# Patient Record
Sex: Male | Born: 1960 | Race: White | Hispanic: Refuse to answer | Smoking: Never smoker
Health system: Southern US, Community
[De-identification: ages and names within clinical notes are randomized; demographics above are authoritative.]

## PROBLEM LIST (undated history)

## (undated) DIAGNOSIS — N183 Chronic kidney disease, stage 3 unspecified: Secondary | ICD-10-CM

## (undated) DIAGNOSIS — I77810 Thoracic aortic ectasia: Secondary | ICD-10-CM

## (undated) DIAGNOSIS — G4730 Sleep apnea, unspecified: Secondary | ICD-10-CM

## (undated) DIAGNOSIS — I10 Essential (primary) hypertension: Secondary | ICD-10-CM

## (undated) DIAGNOSIS — E7800 Pure hypercholesterolemia, unspecified: Secondary | ICD-10-CM

## (undated) DIAGNOSIS — I509 Heart failure, unspecified: Secondary | ICD-10-CM

## (undated) DIAGNOSIS — I428 Other cardiomyopathies: Secondary | ICD-10-CM

## (undated) DIAGNOSIS — I5189 Other ill-defined heart diseases: Secondary | ICD-10-CM

## (undated) DIAGNOSIS — N039 Chronic nephritic syndrome with unspecified morphologic changes: Secondary | ICD-10-CM

## (undated) DIAGNOSIS — I82409 Acute embolism and thrombosis of unspecified deep veins of unspecified lower extremity: Secondary | ICD-10-CM

## (undated) DIAGNOSIS — I130 Hypertensive heart and chronic kidney disease with heart failure and stage 1 through stage 4 chronic kidney disease, or unspecified chronic kidney disease: Secondary | ICD-10-CM

## (undated) DIAGNOSIS — I5042 Chronic combined systolic (congestive) and diastolic (congestive) heart failure: Secondary | ICD-10-CM

## (undated) HISTORY — DX: Other ill-defined heart diseases: I51.89

## (undated) HISTORY — DX: Thoracic aortic ectasia: I77.810

## (undated) HISTORY — DX: Hypertensive heart and chronic kidney disease with heart failure and stage 1 through stage 4 chronic kidney disease, or unspecified chronic kidney disease: I13.0

## (undated) HISTORY — DX: Chronic kidney disease, stage 3 (moderate): N18.3

## (undated) HISTORY — DX: Other cardiomyopathies: I42.8

## (undated) HISTORY — PX: OTHER SURGICAL HISTORY: SHX169

## (undated) HISTORY — DX: Chronic nephritic syndrome with unspecified morphologic changes: I50.9

## (undated) HISTORY — DX: Acute embolism and thrombosis of unspecified deep veins of unspecified lower extremity: I82.409

## (undated) HISTORY — DX: Chronic combined systolic (congestive) and diastolic (congestive) heart failure: I50.42

## (undated) HISTORY — DX: Chronic kidney disease, stage 3 unspecified: N18.30

## (undated) HISTORY — DX: Essential (primary) hypertension: I10

## (undated) HISTORY — DX: Sleep apnea, unspecified: G47.30

## (undated) HISTORY — DX: Chronic nephritic syndrome with unspecified morphologic changes: N03.9

## (undated) HISTORY — DX: Pure hypercholesterolemia, unspecified: E78.00

---

## 2008-09-12 ENCOUNTER — Inpatient Hospital Stay: Payer: Self-pay | Admitting: Internal Medicine

## 2011-09-05 DIAGNOSIS — I82409 Acute embolism and thrombosis of unspecified deep veins of unspecified lower extremity: Secondary | ICD-10-CM

## 2011-09-05 HISTORY — DX: Acute embolism and thrombosis of unspecified deep veins of unspecified lower extremity: I82.409

## 2013-08-16 ENCOUNTER — Encounter: Payer: Self-pay | Admitting: *Deleted

## 2013-08-16 ENCOUNTER — Encounter: Payer: Self-pay | Admitting: Cardiology

## 2013-08-16 DIAGNOSIS — I5042 Chronic combined systolic (congestive) and diastolic (congestive) heart failure: Secondary | ICD-10-CM | POA: Insufficient documentation

## 2013-08-16 DIAGNOSIS — I509 Heart failure, unspecified: Secondary | ICD-10-CM

## 2013-08-16 DIAGNOSIS — N039 Chronic nephritic syndrome with unspecified morphologic changes: Secondary | ICD-10-CM

## 2013-08-16 DIAGNOSIS — G4733 Obstructive sleep apnea (adult) (pediatric): Secondary | ICD-10-CM | POA: Insufficient documentation

## 2013-08-16 DIAGNOSIS — E7800 Pure hypercholesterolemia, unspecified: Secondary | ICD-10-CM | POA: Insufficient documentation

## 2013-08-16 DIAGNOSIS — I82409 Acute embolism and thrombosis of unspecified deep veins of unspecified lower extremity: Secondary | ICD-10-CM | POA: Insufficient documentation

## 2013-08-16 DIAGNOSIS — I130 Hypertensive heart and chronic kidney disease with heart failure and stage 1 through stage 4 chronic kidney disease, or unspecified chronic kidney disease: Secondary | ICD-10-CM | POA: Insufficient documentation

## 2013-08-16 DIAGNOSIS — I429 Cardiomyopathy, unspecified: Secondary | ICD-10-CM | POA: Insufficient documentation

## 2013-08-18 ENCOUNTER — Encounter: Payer: Self-pay | Admitting: General Surgery

## 2013-08-18 ENCOUNTER — Encounter: Payer: Self-pay | Admitting: Cardiology

## 2013-08-18 ENCOUNTER — Ambulatory Visit (INDEPENDENT_AMBULATORY_CARE_PROVIDER_SITE_OTHER): Payer: BC Managed Care – PPO | Admitting: Cardiology

## 2013-08-18 VITALS — BP 145/72 | HR 89 | Ht 66.5 in | Wt 297.0 lb

## 2013-08-18 DIAGNOSIS — I77810 Thoracic aortic ectasia: Secondary | ICD-10-CM | POA: Insufficient documentation

## 2013-08-18 DIAGNOSIS — I509 Heart failure, unspecified: Secondary | ICD-10-CM

## 2013-08-18 DIAGNOSIS — I428 Other cardiomyopathies: Secondary | ICD-10-CM

## 2013-08-18 DIAGNOSIS — G4733 Obstructive sleep apnea (adult) (pediatric): Secondary | ICD-10-CM

## 2013-08-18 DIAGNOSIS — I10 Essential (primary) hypertension: Secondary | ICD-10-CM | POA: Insufficient documentation

## 2013-08-18 DIAGNOSIS — I429 Cardiomyopathy, unspecified: Secondary | ICD-10-CM

## 2013-08-18 DIAGNOSIS — I5042 Chronic combined systolic (congestive) and diastolic (congestive) heart failure: Secondary | ICD-10-CM

## 2013-08-18 LAB — BASIC METABOLIC PANEL
Calcium: 10.1 mg/dL (ref 8.4–10.5)
Creatinine, Ser: 2.2 mg/dL — ABNORMAL HIGH (ref 0.4–1.5)
GFR: 34.54 mL/min — ABNORMAL LOW (ref >60.00)
Glucose, Bld: 104 mg/dL — ABNORMAL HIGH (ref 70–99)
Sodium: 139 mEq/L (ref 135–145)

## 2013-08-18 MED ORDER — CARVEDILOL 6.25 MG PO TABS: 6.2500 mg | ORAL_TABLET | Freq: Two times a day (BID) | ORAL | Status: DC

## 2013-08-18 NOTE — Patient Instructions (Signed)
Your physician recommends that you continue on your current medications as directed. Please refer to the Current Medication list given to you today.  Your physician recommends that you go to the lab today for a BMET panel  Your physician has requested that you have an echocardiogram. Echocardiography is a painless test that uses sound waves to create images of your heart. It provides your doctor with information about the size and shape of your heart and how well your heart's chambers and valves are working. This procedure takes approximately one hour. There are no restrictions for this procedure. (schedule for 08/2014)  Your physician wants you to follow-up in: 6 Months with Dr Sherlyn Lick will receive a reminder letter in the mail two months in advance. If you don't receive a letter, please call our office to schedule the follow-up appointment.

## 2013-08-18 NOTE — Progress Notes (Signed)
[address] [place], [place]  [address] Phone: [phone] Fax:  [phone]  Date:  08/18/2013   ID:  Joel Hammond, DOB October 20, 1960, MRN [id number]  PCP:  REDMON,NOELLE, PA-C  Cardiologist:  Armanda Magic, MD     History of Present Illness: Joel Hammond is a 52 y.o. male with a history of HTN, severe OSA, DCM and chronic combined diastolic/systolic CHF who presents today for followup.  He was diagosed with a DCM a year ago after having H1N1 PNA complicated by sepsis and renal failure.  He was treated at [facility] and then went to [place] in the summer and got PNA again along with a CHF exacerbation.  He had run out of his Coreg.  He was started back on Coreg in August and continued on aldactone/Lasix and ARB.  He is doing well.  He denies any chest pain, SOB, DOE, LE edema, dizziness (except when standing up quickly), palpitations or syncope.  He is doing well with his CPAP therapy and tolerates it well.  He uses the full face mask fairly well and feels the pressure is adequate.  He feels rested in the am and has no daytime sleepiness.     Wt Readings from Last 3 Encounters:  08/18/13 297 lb (134.718 kg)  08/18/13 290 lb (131.543 kg)     Past Medical History  Diagnosis Date  . Pure hypercholesterolemia   . Cardiomyopathy   . CHF (congestive heart failure)   . Sleep apnea     Severe  . DVT (deep venous thrombosis) 2013    Related to being sedentary while hospitalized  . CKD (chronic kidney disease)   . 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 aspirin 81 MG tablet Take 81 mg by mouth daily.      Marland Kitchen atorvastatin (LIPITOR) 10 MG tablet Take 10 mg by mouth daily.      . carvedilol (COREG) 6.25 MG tablet Take 6.25 mg by mouth 2 (two) times daily with a meal.      . furosemide (LASIX) 40 MG tablet  Take 40 mg by mouth.      . IRON PO Take by mouth daily.      . isosorbide mononitrate (IMDUR) 30 MG 24 hr tablet Take 30 mg by mouth daily.      Marland Kitchen losartan (COZAAR) 25 MG tablet Take 25 mg by mouth daily.      . Multiple Vitamin (MULTIVITAMIN) capsule Take 1 capsule by mouth daily.      Marland Kitchen spironolactone (ALDACTONE) 25 MG tablet Take 25 mg by mouth 2 (two) times daily.        No current facility-administered medications for this visit.    Allergies:   No Known Allergies  Social History:  The patient  reports that he has never smoked. He does not have any smokeless tobacco history on file. He reports that he does not drink alcohol or use illicit drugs.   Family History:  The patient's family history is not on file.   ROS:  Please see the history of present illness.      All other systems reviewed and negative.   PHYSICAL EXAM: VS:  BP 145/72  Pulse 89  Ht 5' 6.5" (1.689 m)  Wt 297 lb (134.718 kg)  BMI 47.22 kg/m2 Well nourished, well developed, in no  acute distress HEENT: normal Neck: no JVD Cardiac:  normal S1, S2; RRR; no murmur Lungs:  clear to auscultation bilaterally, no wheezing, rhonchi or rales Abd: soft, nontender, no hepatomegaly Ext: no edema Skin: warm and dry Neuro:  CNs 2-12 intact, no focal abnormalities noted       ASSESSMENT AND PLAN:  1. 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   2. Chronic combined systolic/diastolic CHF appears compensated NYHA Class I  - continue carvidolol/Lasix/Cozaar/aldactone/imdur   - check BMET 3. HTN - well controlled 4. Severe OSA on CPAP therapy  - continue current therapy   - I will get a download from his DME 5.  Mildly dilated aortic root  - recheck echo in 1 year  Followup with me in  6 months  Signed, Armanda Magic, MD 08/18/2013 3:06 PM

## 2013-08-19 NOTE — Progress Notes (Signed)
Hold Lasix for 2 days then decrease Lasix to 20mg  daily and recheck BMET on Monday 12/19

## 2013-08-20 NOTE — Progress Notes (Signed)
LVM for pt to return call

## 2013-08-25 ENCOUNTER — Telehealth: Payer: Self-pay | Admitting: Cardiology

## 2013-08-25 ENCOUNTER — Telehealth: Payer: Self-pay | Admitting: General Surgery

## 2013-08-25 DIAGNOSIS — Z79899 Other long term (current) drug therapy: Secondary | ICD-10-CM

## 2013-08-25 MED ORDER — FUROSEMIDE 40 MG PO TABS: 20.0000 mg | ORAL_TABLET | Freq: Every day | ORAL | Status: DC

## 2013-08-25 NOTE — Telephone Encounter (Signed)
Message copied by Nita Sells on Mon Aug 25, 2013  2:32 PM ------      Message from: Quintella Reichert      Created: Tue Aug 19, 2013  5:46 PM       Please have patient hold Lasix for 2 days then decrease dose to 20mg  daily and recheck BMet on Monday 12/19 and forward to PCP ------

## 2013-08-25 NOTE — Telephone Encounter (Signed)
Pt made aware on VM as requested by pt. Med list updated and lab ordered for Friday 12/26 for repeat bmet. Forwarding to PCP

## 2013-08-25 NOTE — Telephone Encounter (Signed)
Follow up    Pt asked that you leave the med changes on answering machine it pt does not pick up please.  Test results as well.

## 2013-08-26 NOTE — Telephone Encounter (Signed)
Follow Up  Pt returned call. Please leave a detailed explanation on VM if Pt cannot be reached.

## 2013-08-26 NOTE — Telephone Encounter (Signed)
Called pt and LVM again to explain the med changes and when he was due to come back in for lab work.

## 2013-08-29 ENCOUNTER — Other Ambulatory Visit: Payer: Self-pay

## 2014-05-26 ENCOUNTER — Ambulatory Visit: Payer: Self-pay | Admitting: Cardiology

## 2014-12-10 ENCOUNTER — Other Ambulatory Visit: Payer: Self-pay

## 2014-12-10 MED ORDER — CARVEDILOL 6.25 MG PO TABS: 6.2500 mg | ORAL_TABLET | Freq: Two times a day (BID) | ORAL | Status: DC

## 2015-01-30 ENCOUNTER — Other Ambulatory Visit: Payer: Self-pay | Admitting: Cardiology

## 2015-02-05 ENCOUNTER — Ambulatory Visit (INDEPENDENT_AMBULATORY_CARE_PROVIDER_SITE_OTHER): Payer: BLUE CROSS/BLUE SHIELD | Admitting: Emergency Medicine

## 2015-02-05 VITALS — BP 116/80 | HR 87 | Temp 99.1°F | Resp 16 | Ht 67.0 in | Wt 308.2 lb

## 2015-02-05 DIAGNOSIS — M10079 Idiopathic gout, unspecified ankle and foot: Secondary | ICD-10-CM | POA: Diagnosis not present

## 2015-02-05 DIAGNOSIS — Z0001 Encounter for general adult medical examination with abnormal findings: Secondary | ICD-10-CM

## 2015-02-05 DIAGNOSIS — E785 Hyperlipidemia, unspecified: Secondary | ICD-10-CM | POA: Diagnosis not present

## 2015-02-05 DIAGNOSIS — R6889 Other general symptoms and signs: Secondary | ICD-10-CM

## 2015-02-05 DIAGNOSIS — I420 Dilated cardiomyopathy: Secondary | ICD-10-CM

## 2015-02-05 DIAGNOSIS — M109 Gout, unspecified: Secondary | ICD-10-CM

## 2015-02-05 DIAGNOSIS — I10 Essential (primary) hypertension: Secondary | ICD-10-CM

## 2015-02-05 LAB — COMPREHENSIVE METABOLIC PANEL
ALT: 19 U/L (ref 0–53)
AST: 15 U/L (ref 0–37)
Albumin: 4.3 g/dL (ref 3.5–5.2)
Alkaline Phosphatase: 39 U/L (ref 39–117)
BILIRUBIN TOTAL: 0.4 mg/dL (ref 0.2–1.2)
BUN: 25 mg/dL — ABNORMAL HIGH (ref 6–23)
CALCIUM: 9.4 mg/dL (ref 8.4–10.5)
CO2: 25 mEq/L (ref 19–32)
CREATININE: 1.3 mg/dL (ref 0.50–1.35)
Chloride: 107 mEq/L (ref 96–112)
GLUCOSE: 96 mg/dL (ref 70–99)
Potassium: 5 mEq/L (ref 3.5–5.3)
SODIUM: 143 meq/L (ref 135–145)
Total Protein: 6.8 g/dL (ref 6.0–8.3)

## 2015-02-05 LAB — LIPID PANEL
CHOLESTEROL: 169 mg/dL (ref 0–200)
HDL: 41 mg/dL (ref >=40)
LDL Cholesterol: 83 mg/dL (ref 0–99)
Total CHOL/HDL Ratio: 4.1 Ratio
Triglycerides: 227 mg/dL — ABNORMAL HIGH (ref <150)
VLDL: 45 mg/dL — ABNORMAL HIGH (ref 0–40)

## 2015-02-05 LAB — URIC ACID: Uric Acid, Serum: 9.9 mg/dL — ABNORMAL HIGH (ref 4.0–7.8)

## 2015-02-05 NOTE — Progress Notes (Addendum)
Subjective:  Patient ID: Joel Hammond, male    DOB: 10-24-1960  Age: 54 y.o. MRN: [id number]  CC: Annual Exam   HPI [name] presents  for a annual physical examination for his insurance company. Numerous medication medical problems including cardiomyopathy sleep apnea DVT and chronic kidney disease. He has hypertension and hyperlipidemia. He denies any current complaint.  He underwent a colonoscopy last year with removal of 3 polyps.  He is under the care of his regular doctor.  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   He has past surgical history that includes Trauma to finger.   His  family history includes Cancer in his father; Hypertension in his father.  He   reports that he has never smoked. He does not have any smokeless tobacco history on file. He reports that he does not drink alcohol or use illicit drugs.  Outpatient Prescriptions Prior to Visit  Medication Sig Dispense Refill  . aspirin 81 MG tablet Take 81 mg by mouth daily.    Marland Kitchen atorvastatin (LIPITOR) 10 MG tablet Take 10 mg by mouth daily.    . carvedilol (COREG) 6.25 MG tablet Take 1 tablet (6.25 mg total) by mouth 2 (two) times daily with a meal. 60 tablet 0  . IRON PO Take by mouth daily.    . isosorbide mononitrate (IMDUR) 30 MG 24 hr tablet Take 30 mg by mouth daily.    Marland Kitchen losartan (COZAAR) 25 MG tablet Take 25 mg by mouth daily.    . Multiple Vitamin (MULTIVITAMIN) capsule Take 1 capsule by mouth daily.    Marland Kitchen spironolactone (ALDACTONE) 25 MG tablet Take 25 mg by mouth 2 (two) times daily.       . furosemide (LASIX) 40 MG tablet Take 0.5 tablets (20 mg total) by mouth daily. (Patient not taking: Reported on 02/05/2015) 30 tablet    No facility-administered medications prior to visit.    History   Social History  . Marital Status: Married    Spouse Name: N/A  . Number of Children: N/A  . Years of Education: N/A   Social History Main Topics  . Smoking status: Never Smoker   . Smokeless tobacco: Not on file  . Alcohol Use: No  . Drug Use: No  . Sexual Activity: Not on file   Other Topics Concern  . None   Social History Narrative     Review of Systems  Constitutional: Negative for fever, chills and appetite change.  HENT: Negative for congestion, ear pain, postnasal drip, sinus pressure and sore throat.   Eyes: Negative for pain and redness.  Respiratory: Positive for shortness of breath. Negative for cough, chest tightness and wheezing.   Cardiovascular: Negative for leg swelling.  Gastrointestinal: Negative for nausea, vomiting, abdominal pain, diarrhea, constipation and blood in stool.  Endocrine: Negative for polyuria.  Genitourinary: Negative for dysuria, urgency, frequency and flank pain.  Musculoskeletal: Negative for gait problem.  Skin: Negative for rash.  Neurological: Negative for weakness and headaches.  Psychiatric/Behavioral: Negative for confusion and decreased concentration. The patient is not nervous/anxious.     Objective:  BP 116/80 mmHg  Pulse 87  Temp(Src) 99.1 F (37.3 C) (Oral)  Resp 16  Ht 5\' 7"  (1.702 m)  Wt 308 lb 3.2 oz (139.799 kg)  BMI 48.26 kg/m2  SpO2 98%  Physical Exam  Constitutional: He is oriented to person, place, and time. He appears well-developed and well-nourished. No distress.  HENT:  Head: Normocephalic and atraumatic.  Right Ear: External ear normal.  Left Ear: External ear normal.  Nose: Nose normal.  Eyes: Conjunctivae and EOM are normal. Pupils are equal, round, and reactive to light. No scleral icterus.   Neck: Normal range of motion. Neck supple. No tracheal deviation present.  Cardiovascular: Normal rate, regular rhythm and normal heart sounds.   Pulmonary/Chest: Effort normal. No respiratory distress. He has no wheezes. He has no rales.  Abdominal: He exhibits no mass. There is no tenderness. There is no rebound and no guarding.  Musculoskeletal: He exhibits no edema.  Lymphadenopathy:    He has no cervical adenopathy.  Neurological: He is alert and oriented to person, place, and time. Coordination normal.  Skin: Skin is warm and dry. No rash noted.  Psychiatric: He has a normal mood and affect. His behavior is normal.      Assessment & Plan:   [name] was seen today for annual exam.  Diagnoses and all orders for this visit:  Encounter for general adult medical examination with abnormal findings Orders: -     Comprehensive metabolic panel -     CBC with Differential -     Lipid panel -     PSA(Must document that pt has been informed of limitations of PSA testing.) -     TSH   I am having Mr. Hyacinth MeekerMiller maintain his atorvastatin, spironolactone, losartan, multivitamin, aspirin, isosorbide mononitrate, IRON PO, furosemide, and carvedilol.  No orders of the defined types were placed in this encounter.   Labs are pending he was referred back to his regular doctor and will follow up with his cardiologist as indicated   Appropriate red flag conditions were discussed with the patient as well as actions that should be taken.  Patient expressed his understanding.  Follow-up: Return in 1 year (on 02/05/2016), or if symptoms worsen or fail to improve.  Carmelina DaneAnderson, Khamiya Varin S, MD

## 2015-02-05 NOTE — Addendum Note (Signed)
Addended by: Cydney OkAUGUSTIN, Betsy Rosello N on: 02/05/2015 02:04 PM   Modules accepted: Orders

## 2015-02-05 NOTE — Patient Instructions (Signed)

## 2015-02-06 LAB — CBC WITH DIFFERENTIAL/PLATELET
BASOS ABS: 0 10*3/uL (ref 0.0–0.1)
BASOS PCT: 0 % (ref 0–1)
EOS ABS: 0.3 10*3/uL (ref 0.0–0.7)
EOS PCT: 3 % (ref 0–5)
HCT: 41.1 % (ref 39.0–52.0)
Hemoglobin: 13.6 g/dL (ref 13.0–17.0)
LYMPHS ABS: 1.8 10*3/uL (ref 0.7–4.0)
Lymphocytes Relative: 16 % (ref 12–46)
MCH: 30 pg (ref 26.0–34.0)
MCHC: 33.1 g/dL (ref 30.0–36.0)
MCV: 90.7 fL (ref 78.0–100.0)
MONO ABS: 0.6 10*3/uL (ref 0.1–1.0)
MONOS PCT: 5 % (ref 3–12)
MPV: 10.9 fL (ref 8.6–12.4)
NEUTROS PCT: 76 % (ref 43–77)
Neutro Abs: 8.5 10*3/uL — ABNORMAL HIGH (ref 1.7–7.7)
Platelets: 253 10*3/uL (ref 150–400)
RBC: 4.53 MIL/uL (ref 4.22–5.81)
RDW: 13.7 % (ref 11.5–15.5)
WBC: 11.2 10*3/uL — ABNORMAL HIGH (ref 4.0–10.5)

## 2015-02-06 LAB — PSA: PSA: 0.53 ng/mL (ref <=4.00)

## 2015-02-06 LAB — TSH: TSH: 1.618 u[IU]/mL (ref 0.350–4.500)

## 2015-02-12 ENCOUNTER — Ambulatory Visit: Payer: Self-pay | Admitting: Cardiology

## 2015-02-26 ENCOUNTER — Other Ambulatory Visit: Payer: Self-pay

## 2015-02-26 MED ORDER — CARVEDILOL 6.25 MG PO TABS: 6.2500 mg | ORAL_TABLET | Freq: Two times a day (BID) | ORAL | Status: DC

## 2015-02-26 NOTE — Telephone Encounter (Signed)
Ok to refill x 1 He has an office visit in August with Dr. Mayford Knife

## 2015-04-12 ENCOUNTER — Encounter: Payer: Self-pay | Admitting: Cardiology

## 2015-04-12 ENCOUNTER — Ambulatory Visit (INDEPENDENT_AMBULATORY_CARE_PROVIDER_SITE_OTHER): Payer: BLUE CROSS/BLUE SHIELD | Admitting: Cardiology

## 2015-04-12 VITALS — BP 140/86 | HR 98 | Ht 67.0 in | Wt 300.4 lb

## 2015-04-12 DIAGNOSIS — I429 Cardiomyopathy, unspecified: Secondary | ICD-10-CM | POA: Diagnosis not present

## 2015-04-12 DIAGNOSIS — I77810 Thoracic aortic ectasia: Secondary | ICD-10-CM

## 2015-04-12 DIAGNOSIS — G4733 Obstructive sleep apnea (adult) (pediatric): Secondary | ICD-10-CM

## 2015-04-12 DIAGNOSIS — I5042 Chronic combined systolic (congestive) and diastolic (congestive) heart failure: Secondary | ICD-10-CM | POA: Diagnosis not present

## 2015-04-12 DIAGNOSIS — I10 Essential (primary) hypertension: Secondary | ICD-10-CM | POA: Diagnosis not present

## 2015-04-12 LAB — BASIC METABOLIC PANEL
BUN: 24 mg/dL — AB (ref 6–23)
CO2: 25 mEq/L (ref 19–32)
Calcium: 9.5 mg/dL (ref 8.4–10.5)
Chloride: 109 mEq/L (ref 96–112)
Creatinine, Ser: 1.51 mg/dL — ABNORMAL HIGH (ref 0.40–1.50)
GFR: 51.86 mL/min — ABNORMAL LOW (ref >60.00)
Glucose, Bld: 119 mg/dL — ABNORMAL HIGH (ref 70–99)
Potassium: 4.4 mEq/L (ref 3.5–5.1)
SODIUM: 142 meq/L (ref 135–145)

## 2015-04-12 NOTE — Progress Notes (Signed)
Cardiology Office Note   Date:  04/12/2015   ID:  Joel Hammond, DOB 17-Oct-1960, MRN [id number]  PCP:  REDMON,NOELLE, PA-C    Chief Complaint  Patient presents with  . Follow-up    chronic combined systolic and diastolic CHF      History of Present Illness: Joel Hammond is a 54 y.o. male with a history of HTN, severe OSA, DCM and chronic combined diastolic/systolic CHF who presents today for followup. He was diagosed with a DCM after having H1N1 PNA complicated by sepsis and renal failure. He was treated at [facility] and then went to [place] in the summer and got PNA again along with a CHF exacerbation. He had run out of his Coreg. He was started back on Coreg  and continued on aldactone/Lasix and ARB. He is doing well. He denies any chest pain, SOB, DOE, LE edema, dizziness (except when standing up quickly), palpitations or syncope. He is doing well with his CPAP therapy and tolerates it well. He uses the full face mask fairly well and feels the pressure is adequate. He feels rested in the am and has no daytime sleepiness.  He is complaining of intermittent tingling in the hands and fingers and lasts for 15-20 minutes in both hands.  There is no associated chest pain or pressure, diaphoresis or nausea.      Past Medical History  Diagnosis Date  . Pure hypercholesterolemia   . Cardiomyopathy   . Sleep apnea     Severe  . DVT (deep venous thrombosis) 2013    Related to being sedentary while hospitalized  . CKD (chronic kidney disease)   . Aortic root dilatation   . Diastolic dysfunction   . Chronic combined systolic and diastolic CHF (congestive heart failure)   . 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 Hypertension     Past Surgical History  Procedure Laterality Date  . Trauma to finger       Current Outpatient Prescriptions  Medication  Sig Dispense Refill  . aspirin 81 MG tablet Take 81 mg by mouth daily.    Marland Kitchen atorvastatin (LIPITOR) 20 MG tablet Take 20 mg by mouth daily.  1  . carvedilol (COREG) 6.25 MG tablet Take 1 tablet (6.25 mg total) by mouth 2 (two) times daily with a meal. 60 tablet 0  . IRON PO Take by mouth daily.    . isosorbide mononitrate (IMDUR) 30 MG 24 hr tablet Take 30 mg by mouth daily.    Marland Kitchen losartan (COZAAR) 25 MG tablet Take 25 mg by mouth daily.    . Multiple Vitamin (MULTIVITAMIN) capsule Take 1 capsule by mouth daily.    Marland Kitchen spironolactone (ALDACTONE) 25 MG tablet Take 25 mg by mouth 2 (two) times daily.      No current facility-administered medications for this visit.    Allergies:   Review of patient's allergies indicates no known allergies.    Social History:  The patient  reports that he has never smoked. He does not have any smokeless tobacco history on file. He reports that he does not drink alcohol or use illicit drugs.   Family History:  The patient's family history includes Cancer in his father; Hypertension in his father.    ROS:  Please see the history of present illness.   Otherwise,  review of systems are positive for none.   All other systems are reviewed and negative.    PHYSICAL EXAM: VS:  BP 140/86 mmHg  Pulse 98  Ht 5\' 7"  (1.702 m)  Wt 300 lb 6.4 oz (136.261 kg)  BMI 47.04 kg/m2 , BMI Body mass index is 47.04 kg/(m^2). GEN: Well nourished, well developed, in no acute distress HEENT: normal Neck: no JVD, carotid bruits, or masses Cardiac: RRR; no murmurs, rubs, or gallops,no edema  Respiratory:  clear to auscultation bilaterally, normal work of breathing GI: soft, nontender, nondistended, + BS MS: no deformity or atrophy Skin: warm and dry, no rash Neuro:  Strength and sensation are intact Psych: euthymic mood, full affect   EKG:  EKG is ordered today. The ekg ordered today demonstrates NSR with IRBBB   Recent Labs: 02/05/2015: ALT 19; BUN 25*; Creat 1.30;  Hemoglobin 13.6; Platelets 253; Potassium 5.0; Sodium 143; TSH 1.618    Lipid Panel    Component Value Date/Time   CHOL 169 02/05/2015 1404   TRIG 227* 02/05/2015 1404   HDL 41 02/05/2015 1404   CHOLHDL 4.1 02/05/2015 1404   VLDL 45* 02/05/2015 1404   LDLCALC 83 02/05/2015 1404      Wt Readings from Last 3 Encounters:  04/12/15 300 lb 6.4 oz (136.261 kg)  02/05/15 308 lb 3.2 oz (139.799 kg)  08/18/13 297 lb (134.718 kg)    ASSESSMENT AND PLAN:  1. 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 Repeat echo on maximum medical therapy for heart failure. 2. Chronic combined systolic/diastolic CHF appears compensated NYHA Class I - continue carvidolol/Cozaar/aldactone/imdur  - Check BMET 3. HTN - well controlled 4. Severe OSA on CPAP therapy - continue current therapy  - I will get a download from his DME   5. Mildly dilated aortic root - recheck echo    6.   Intermittent hand numbness with no associated symptoms of CP.  I will get a lexiscan myoview to rule out ischemia but sound more neurologic related.      Current medicines are reviewed at length with the patient today.  The patient does not have concerns regarding medicines.  The following changes have been made:  no change  Labs/ tests ordered today: See above Assessment and Plan No orders of the defined types were placed in this encounter.     Disposition:   FU with me in 1 year  Signed, Quintella Reichert, MD  04/12/2015 2:58 PM    [facility] [address] [place], [place], [place]  [address] Phone: [phone]; Fax: [phone]

## 2015-04-12 NOTE — Patient Instructions (Signed)
Medication Instructions:  Your physician recommends that you continue on your current medications as directed. Please refer to the Current Medication list given to you today.   Labwork: TODAY: BMET  Testing/Procedures: Your physician has requested that you have an echocardiogram. Echocardiography is a painless test that uses sound waves to create images of your heart. It provides your doctor with information about the size and shape of your heart and how well your heart's chambers and valves are working. This procedure takes approximately one hour. There are no restrictions for this procedure.   Your physician has requested that you have a lexiscan myoview. For further information please visit [email or web address] Please follow instruction sheet, as given.  Follow-Up: Your physician wants you to follow-up in: 1 year with Dr. Mayford Knife. You will receive a reminder letter in the mail two months in advance. If you don't receive a letter, please call our office to schedule the follow-up appointment.   Any Other Special Instructions Will Be Listed Below (If Applicable).

## 2015-04-13 ENCOUNTER — Telehealth: Payer: Self-pay

## 2015-04-13 ENCOUNTER — Telehealth (HOSPITAL_COMMUNITY): Payer: Self-pay

## 2015-04-13 DIAGNOSIS — I5042 Chronic combined systolic (congestive) and diastolic (congestive) heart failure: Secondary | ICD-10-CM

## 2015-04-13 MED ORDER — SPIRONOLACTONE 25 MG PO TABS: 25.0000 mg | ORAL_TABLET | Freq: Every day | ORAL | Status: DC

## 2015-04-13 NOTE — Telephone Encounter (Signed)
Informed patient of results and verbal understanding expressed.   Instructed patient to DECREASE ALDACTONE to 25 mg daily. BMET scheduled for 8/15.  Patient agrees with treatment plan

## 2015-04-13 NOTE — Telephone Encounter (Signed)
Patient given detailed instructions per Myocardial Perfusion Study Information Sheet for test on 04-14-2015 at 1215. Patient Notified to arrive 15 minutes early, and that it is imperative to arrive on time for appointment to keep from having the test rescheduled. Patient verbalized understanding. Randa Evens, Vaneta Hammontree A

## 2015-04-13 NOTE — Telephone Encounter (Signed)
-----   Message from Quintella Reichert, MD sent at 04/12/2015  7:10 PM EDT ----- Decrease aldactone to  daily and repeat BMET in 1 week

## 2015-04-14 ENCOUNTER — Ambulatory Visit (HOSPITAL_COMMUNITY): Payer: BLUE CROSS/BLUE SHIELD | Attending: Cardiology

## 2015-04-14 DIAGNOSIS — R0609 Other forms of dyspnea: Secondary | ICD-10-CM | POA: Insufficient documentation

## 2015-04-14 DIAGNOSIS — R079 Chest pain, unspecified: Secondary | ICD-10-CM | POA: Diagnosis not present

## 2015-04-14 DIAGNOSIS — I429 Cardiomyopathy, unspecified: Secondary | ICD-10-CM

## 2015-04-14 MED ORDER — REGADENOSON 0.4 MG/5ML IV SOLN
0.4000 mg | Freq: Once | INTRAVENOUS | Status: AC
  Administered 2015-04-14: 0.4 mg via INTRAVENOUS

## 2015-04-14 MED ORDER — TECHNETIUM TC 99M SESTAMIBI GENERIC - CARDIOLITE
30.2000 | Freq: Once | INTRAVENOUS | Status: AC | PRN
  Administered 2015-04-14: 30.2 via INTRAVENOUS

## 2015-04-15 ENCOUNTER — Other Ambulatory Visit: Payer: Self-pay

## 2015-04-15 ENCOUNTER — Ambulatory Visit (HOSPITAL_BASED_OUTPATIENT_CLINIC_OR_DEPARTMENT_OTHER): Payer: BLUE CROSS/BLUE SHIELD

## 2015-04-15 ENCOUNTER — Ambulatory Visit (HOSPITAL_COMMUNITY): Payer: BLUE CROSS/BLUE SHIELD | Attending: Cardiology

## 2015-04-15 DIAGNOSIS — I517 Cardiomegaly: Secondary | ICD-10-CM | POA: Insufficient documentation

## 2015-04-15 DIAGNOSIS — N189 Chronic kidney disease, unspecified: Secondary | ICD-10-CM | POA: Insufficient documentation

## 2015-04-15 DIAGNOSIS — I129 Hypertensive chronic kidney disease with stage 1 through stage 4 chronic kidney disease, or unspecified chronic kidney disease: Secondary | ICD-10-CM | POA: Diagnosis not present

## 2015-04-15 DIAGNOSIS — I429 Cardiomyopathy, unspecified: Secondary | ICD-10-CM

## 2015-04-15 DIAGNOSIS — I77819 Aortic ectasia, unspecified site: Secondary | ICD-10-CM | POA: Diagnosis not present

## 2015-04-15 DIAGNOSIS — E785 Hyperlipidemia, unspecified: Secondary | ICD-10-CM | POA: Insufficient documentation

## 2015-04-15 DIAGNOSIS — Z8249 Family history of ischemic heart disease and other diseases of the circulatory system: Secondary | ICD-10-CM | POA: Insufficient documentation

## 2015-04-15 LAB — MYOCARDIAL PERFUSION IMAGING
CHL CUP NUCLEAR SDS: 2
CHL CUP NUCLEAR SSS: 5
CSEPPHR: 102 {beats}/min
LHR: 0.35
LV sys vol: 70 mL
LVDIAVOL: 146 mL
Rest HR: 77 {beats}/min
SRS: 3
TID: 1.01

## 2015-04-15 MED ORDER — TECHNETIUM TC 99M SESTAMIBI GENERIC - CARDIOLITE
33.0000 | Freq: Once | INTRAVENOUS | Status: AC | PRN
  Administered 2015-04-15: 33 via INTRAVENOUS

## 2015-04-16 ENCOUNTER — Telehealth: Payer: Self-pay | Admitting: Cardiology

## 2015-04-16 NOTE — Telephone Encounter (Signed)
Cancel Chest CT angio due to chronic kidney disease.  Will repeat echo in 1 year

## 2015-04-19 ENCOUNTER — Other Ambulatory Visit (INDEPENDENT_AMBULATORY_CARE_PROVIDER_SITE_OTHER): Payer: BLUE CROSS/BLUE SHIELD | Admitting: *Deleted

## 2015-04-19 DIAGNOSIS — I5042 Chronic combined systolic (congestive) and diastolic (congestive) heart failure: Secondary | ICD-10-CM | POA: Diagnosis not present

## 2015-04-20 LAB — BASIC METABOLIC PANEL
BUN: 21 mg/dL (ref 6–23)
CALCIUM: 9.4 mg/dL (ref 8.4–10.5)
CO2: 26 meq/L (ref 19–32)
Chloride: 109 mEq/L (ref 96–112)
Creatinine, Ser: 1.45 mg/dL (ref 0.40–1.50)
GFR: 54.34 mL/min — AB (ref >60.00)
Glucose, Bld: 107 mg/dL — ABNORMAL HIGH (ref 70–99)
Potassium: 4.2 mEq/L (ref 3.5–5.1)
SODIUM: 143 meq/L (ref 135–145)

## 2015-04-22 ENCOUNTER — Telehealth: Payer: Self-pay | Admitting: *Deleted

## 2015-04-22 DIAGNOSIS — I10 Essential (primary) hypertension: Secondary | ICD-10-CM

## 2015-04-22 DIAGNOSIS — I77810 Thoracic aortic ectasia: Secondary | ICD-10-CM

## 2015-04-22 NOTE — Telephone Encounter (Signed)
I cb Joel Hammond to advise that there was a message from Dr. Mayford Knife to cancel the Chest CT-A and that we will repeat echo in 1 year. Joel Hammond agreeable to plan of care.   Quintella Reichert, MD at 04/16/2015 8:08 AM    Status: Signed      Expand All Collapse All    Cancel Chest CT angio due to chronic kidney disease. Will repeat echo in 1 year

## 2015-04-22 NOTE — Telephone Encounter (Signed)
Pt notified of echo results by phone with verbal understanding to findings. Pt agreeable to Chest CT-A. I advised our office will call back with date and time for ct, and echo we will repeat in 1 yr, pt said ok and thank you.

## 2015-04-26 ENCOUNTER — Telehealth (HOSPITAL_COMMUNITY): Payer: Self-pay | Admitting: *Deleted

## 2015-04-26 NOTE — Addendum Note (Signed)
Addended by: Gunnar Fusi A on: 04/26/2015 10:37 AM   Modules accepted: Orders

## 2015-04-26 NOTE — Telephone Encounter (Signed)
Repeat ECHO ordered for scheduling in one year.

## 2015-04-29 ENCOUNTER — Other Ambulatory Visit: Payer: Self-pay

## 2015-05-14 ENCOUNTER — Encounter: Payer: Self-pay | Admitting: Cardiology

## 2015-07-28 ENCOUNTER — Telehealth: Payer: Self-pay | Admitting: Cardiology

## 2015-07-28 MED ORDER — CARVEDILOL 6.25 MG PO TABS: 6.2500 mg | ORAL_TABLET | Freq: Two times a day (BID) | ORAL | Status: DC

## 2015-07-28 NOTE — Telephone Encounter (Signed)
Pt's Rx was sent to pt's pharmacy as requested. Confirmation received.  °

## 2016-04-02 IMAGING — NM NM MYOCAR MULTI W/ SPECT
4 series · 24 of 24 positions shown · non-contrast
Comparison: none

[Series 1: stress · 6.51mm/px · 6 of 64 frames shown (1 of 2)]
[frame 6/64]
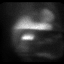
[frame 16/64]
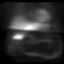
[frame 27/64]
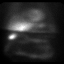
[frame 38/64]
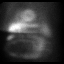
[frame 48/64]
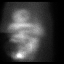
[frame 59/64]
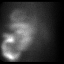

[Series 1: stress · 6.51mm/px · 6 of 512 frames shown (2 of 2)]
[frame 43/512]
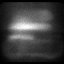
[frame 128/512]
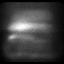
[frame 214/512]
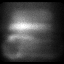
[frame 299/512]
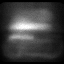
[frame 384/512]
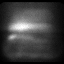
[frame 470/512]
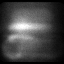

[Series 2: rest · 6.51mm/px · 6 of 64 frames shown (1 of 2)]
[frame 6/64]
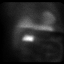
[frame 16/64]
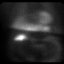
[frame 27/64]
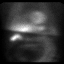
[frame 38/64]
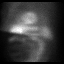
[frame 48/64]
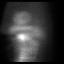
[frame 59/64]
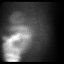

[Series 3: rest · 6.51mm/px · 6 of 64 frames shown (2 of 2)]
[frame 6/64]
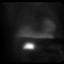
[frame 16/64]
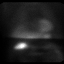
[frame 27/64]
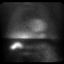
[frame 38/64]
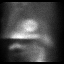
[frame 48/64]
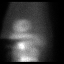
[frame 59/64]
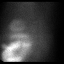

[24 of 24 positions shown; findings below may reference images not displayed]

Canned report from images found in remote index.

Refer to host system for actual result text.

## 2016-04-26 ENCOUNTER — Other Ambulatory Visit (HOSPITAL_COMMUNITY): Payer: Self-pay

## 2016-09-27 ENCOUNTER — Ambulatory Visit: Payer: Self-pay | Admitting: Cardiology

## 2017-07-13 ENCOUNTER — Encounter: Payer: Self-pay | Admitting: Physician Assistant

## 2017-07-13 DIAGNOSIS — N183 Chronic kidney disease, stage 3 unspecified: Secondary | ICD-10-CM | POA: Insufficient documentation

## 2017-07-13 NOTE — Progress Notes (Addendum)
Cardiology Office Note    Date:  07/16/2017  ID:  Joel Hammond, DOB Jul 25, 1961, MRN [id number] PCP:  Milus Heightedmon, Noelle, PA-C  Cardiologist:  Dr. Mayford Knifeurner   Chief Complaint: f/u CHF  History of Present Illness:  Joel Hammond is a 56 y.o. male with history of HTN, severe OSA, chronic combined diastolic/systolic CHF, CKD stage III, prior DVT 2013, HLD, mildly dilated aortic root who presents for overdue follow-up.   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 In June of 2013, while traveling through [place], he fell ill with a viral infection with H1N1 and was hospitalized when he arrived in the [place]. His hospitalization was complicated by sepsis and respiratory failure and acute renal failure, requiring dialysis for several weeks. He also developed a DVT of his right lower extremity during that hospitalization, for which he has been treated with anticoagulation. He was [facility] with new onset CHF with EF 25%. Nuclear stress test was "possibly abnormal with inferior ischemia although borderline." Cardiology recommended consideration for OP cath but do not see this was ever pursued. He transitioned his care to Dr. Mayford Knifeurner who felt his cardiomyopathy was felt possibly viral in nature. Last echo 04/2015 showed EF 55-60%, grade 1 DD, mildly dilated ascending aorta, elevated CVP. Nuclear stress test 04/2015 showed EF 52%, otherwise normal. Last labs 2016 showed K 4.2, Cr 1.45, TSH wnl, LDL 83.  He presents for overdue to follow-up and to discuss his test results from 2016. In general he's been doing very well. No CP, SOB, palpitations, syncope. He does not usually check his blood pressure because his machine is too bulky to travel with. While travelling in [place] a few months ago he did have ankle pain and swelling and was diagnosed with gout but this resolved. He reports compliance with meds and CPAP. He can definitely tell a difference in  how he feels wearing his CPAP. A few days ago he accidentally took an old atorvastatin and developed a rash on his right arm. This was similar to what happened the last time he took it, except this time he developed a large red area on his arm that appears like an abrasion with scabbing and erythema. He states he was told by a pharmacist it might be shingles. It came up 2 days ago and he's been keeping it covered.    Past Medical History:  Diagnosis Date  . Aortic root dilatation (HCC)   . 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 Chronic combined systolic and diastolic CHF (congestive heart failure) (HCC)   . CKD (chronic kidney disease), stage III (HCC)   . Diastolic dysfunction   . DVT (deep venous thrombosis) (HCC) 2013   Related to being sedentary while hospitalized  . Hypertension   . NICM (nonischemic cardiomyopathy) (HCC)    a. EF 25% following severe illness in 2013, ? viral cardiomyopathy.  . Pure hypercholesterolemia   . Sleep apnea    Severe    Past Surgical History:  Procedure Laterality Date  . Trauma to finger      Current Medications: Current Meds  Medication Sig  . aspirin 81 MG tablet Take 81 mg by mouth daily.  . carvedilol (COREG) 6.25 MG tablet Take 1 tablet (6.25 mg total) by mouth 2 (two) times daily with a meal.  . IRON PO  Take by mouth daily.  . isosorbide mononitrate (IMDUR) 30 MG 24 hr tablet Take 30 mg by mouth daily.  Marland Kitchen. losartan (COZAAR) 25 MG tablet Take 25 mg by mouth daily.  . meloxicam (MOBIC) 15 MG tablet Take 15 mg daily by mouth.  . Multiple Vitamin (MULTIVITAMIN) capsule Take 1 capsule by mouth daily.  . Omega-3 Fatty Acids (FISH OIL) 1000 MG CAPS Take 1 capsule daily by mouth.  . spironolactone (ALDACTONE) 25 MG tablet Take 1 tablet (25 mg total) by mouth daily.  . [DISCONTINUED] atorvastatin (LIPITOR) 20 MG tablet Take 20 mg by mouth  daily.     Allergies:   Ace inhibitors   Social History   Socioeconomic History  . Marital status: Married    Spouse name: None  . Number of children: None  . Years of education: None  . Highest education level: None  Social Needs  . Financial resource strain: None  . Food insecurity - worry: None  . Food insecurity - inability: None  . Transportation needs - medical: None  . Transportation needs - non-medical: None  Occupational History  . None  Tobacco Use  . Smoking status: Never Smoker  . Smokeless tobacco: Never Used  Substance and Sexual Activity  . Alcohol use: No  . Drug use: No  . Sexual activity: None  Other Topics Concern  . None  Social History Narrative  . None     Family History:  Family History  Problem Relation Age of Onset  . Cancer Father   . Hypertension Father      ROS:   Please see the history of present illness.  All other systems are reviewed and otherwise negative.    PHYSICAL EXAM:   VS:  BP (!) 142/82   Pulse 72   Ht 5\' 7"  (1.702 m)   Wt (!) 304 lb 3.2 oz (138 kg)   SpO2 96%   BMI 47.64 kg/m   BMI: Body mass index is 47.64 kg/m. GEN: Well nourished, well developed morbidly obese WM, in no acute distress  HEENT: normocephalic, atraumatic Neck: no JVD, carotid bruits, or masses Cardiac: RRR; no murmurs, rubs, or gallops, no edema  Respiratory:  clear to auscultation bilaterally, normal work of breathing GI: soft, nontender, nondistended, + BS MS: no deformity or atrophy  Skin: warm and dry, right arm papular rash as well as large red rash/abrasion-type appearance with scabbing (about the size of a coffee cup), no pus or open bleeding Neuro:  Alert and Oriented x 3, Strength and sensation are intact, follows commands Psych: euthymic mood, full affect  Wt Readings from Last 3 Encounters:  07/16/17 (!) 304 lb 3.2 oz (138 kg)  04/14/15 300 lb (136.1 kg)  04/12/15 (!) 300 lb 6.4 oz (136.3 kg)      Studies/Labs Reviewed:    EKG:  EKG was ordered today and personally reviewed by me and demonstrates NSR, no acute ST-T changes, 72bpm  Recent Labs: No results found for requested labs within last 8760 hours.   Lipid Panel    Component Value Date/Time   CHOL 169 02/05/2015 1404   TRIG 227 (H) 02/05/2015 1404   HDL 41 02/05/2015 1404   CHOLHDL 4.1 02/05/2015 1404   VLDL 45 (H) 02/05/2015 1404   LDLCALC 83 02/05/2015 1404    Additional studies/ records that were reviewed today include: Summarized above.    ASSESSMENT & PLAN:   1. Chronic combined CHF/suspected NICM - doing well. Appears euvolemic. Will repeat echocardiogram  to ensure stability of LVEF. Reviewed 2g sodium restriction, 2L fluid restriction, daily weights with patient. He remains morbidly obese at this time but acknowledges he has not been following diet as he should. Will update labs while he is here. 2. Essential HTN - he reports compliance with meds and took them today earlier. Blood pressure elevated at 130/90 on initial check, 142/82 on recheck. Goal BP should be 120/80 given history of #1. Will increase carvedilol to 12.5mg  BID. (This was sent in after AVS was printed but I handwrote the correction on the AVS.) I asked him to periodically monitor at home and call if tending to get readings of greater than 130 on the top number or 80 on the bottom number. Update labs including TSH. 3. CKD stage III - recheck Cr today. 4. OSA - remains compliant with CPAP, but has not been seen in 2 years for visit. Will arrange sleep f/u with Dr. Mayford Knife. 5. Mildly dilated ascending aorta - overdue for f/u echo, will arrange.  Disposition: F/u with Dr. Mayford Knife next available for sleep visit. I also strongly advised he keep f/u with PCP today to address rash on right arm. We sent off a CBC today and will communicate the result to PCP if significant leukocytosis is noted.   Medication Adjustments/Labs and Tests Ordered: Current medicines are reviewed at  length with the patient today.  Concerns regarding medicines are outlined above. Medication changes, Labs and Tests ordered today are summarized above and listed in the Patient Instructions accessible in Encounters.   Signed, Laurann Montana, PA-C  07/16/2017 10:16 AM    [facility] [address] [place], [place], [place]  [address] Phone: [phone]; Fax: [phone]

## 2017-07-16 ENCOUNTER — Ambulatory Visit: Payer: BLUE CROSS/BLUE SHIELD | Admitting: Physician Assistant

## 2017-07-16 ENCOUNTER — Encounter: Payer: Self-pay | Admitting: Physician Assistant

## 2017-07-16 ENCOUNTER — Encounter (INDEPENDENT_AMBULATORY_CARE_PROVIDER_SITE_OTHER): Payer: Self-pay

## 2017-07-16 VITALS — BP 142/82 | HR 72 | Ht 67.0 in | Wt 304.2 lb

## 2017-07-16 DIAGNOSIS — Z125 Encounter for screening for malignant neoplasm of prostate: Secondary | ICD-10-CM | POA: Diagnosis not present

## 2017-07-16 DIAGNOSIS — I77810 Thoracic aortic ectasia: Secondary | ICD-10-CM | POA: Diagnosis not present

## 2017-07-16 DIAGNOSIS — I10 Essential (primary) hypertension: Secondary | ICD-10-CM

## 2017-07-16 DIAGNOSIS — N183 Chronic kidney disease, stage 3 unspecified: Secondary | ICD-10-CM

## 2017-07-16 DIAGNOSIS — M109 Gout, unspecified: Secondary | ICD-10-CM | POA: Diagnosis not present

## 2017-07-16 DIAGNOSIS — I5042 Chronic combined systolic (congestive) and diastolic (congestive) heart failure: Secondary | ICD-10-CM

## 2017-07-16 DIAGNOSIS — G4733 Obstructive sleep apnea (adult) (pediatric): Secondary | ICD-10-CM | POA: Diagnosis not present

## 2017-07-16 DIAGNOSIS — I428 Other cardiomyopathies: Secondary | ICD-10-CM | POA: Diagnosis not present

## 2017-07-16 DIAGNOSIS — I130 Hypertensive heart and chronic kidney disease with heart failure and stage 1 through stage 4 chronic kidney disease, or unspecified chronic kidney disease: Secondary | ICD-10-CM | POA: Diagnosis not present

## 2017-07-16 LAB — COMPREHENSIVE METABOLIC PANEL
A/G RATIO: 1.8 (ref 1.2–2.2)
ALBUMIN: 4.1 g/dL (ref 3.5–5.5)
ALT: 17 IU/L (ref 0–44)
AST: 11 IU/L (ref 0–40)
Alkaline Phosphatase: 48 IU/L (ref 39–117)
BUN / CREAT RATIO: 16 (ref 9–20)
BUN: 20 mg/dL (ref 6–24)
Bilirubin Total: 0.3 mg/dL (ref 0.0–1.2)
CALCIUM: 9.4 mg/dL (ref 8.7–10.2)
CO2: 25 mmol/L (ref 20–29)
CREATININE: 1.26 mg/dL (ref 0.76–1.27)
Chloride: 105 mmol/L (ref 96–106)
GFR calc Af Amer: 74 mL/min/{1.73_m2} (ref >59)
GFR, EST NON AFRICAN AMERICAN: 64 mL/min/{1.73_m2} (ref >59)
GLOBULIN, TOTAL: 2.3 g/dL (ref 1.5–4.5)
Glucose: 106 mg/dL — ABNORMAL HIGH (ref 65–99)
POTASSIUM: 4.4 mmol/L (ref 3.5–5.2)
SODIUM: 143 mmol/L (ref 134–144)
Total Protein: 6.4 g/dL (ref 6.0–8.5)

## 2017-07-16 LAB — TSH: TSH: 3.32 u[IU]/mL (ref 0.450–4.500)

## 2017-07-16 LAB — CBC
Hematocrit: 42.8 % (ref 37.5–51.0)
Hemoglobin: 14.4 g/dL (ref 13.0–17.7)
MCH: 30.6 pg (ref 26.6–33.0)
MCHC: 33.6 g/dL (ref 31.5–35.7)
MCV: 91 fL (ref 79–97)
PLATELETS: 215 10*3/uL (ref 150–379)
RBC: 4.71 x10E6/uL (ref 4.14–5.80)
RDW: 13.5 % (ref 12.3–15.4)
WBC: 9.1 10*3/uL (ref 3.4–10.8)

## 2017-07-16 LAB — LIPID PANEL
CHOLESTEROL TOTAL: 169 mg/dL (ref 100–199)
Chol/HDL Ratio: 4.2 ratio (ref 0.0–5.0)
HDL: 40 mg/dL (ref >39)
LDL Calculated: 89 mg/dL (ref 0–99)
Triglycerides: 199 mg/dL — ABNORMAL HIGH (ref 0–149)
VLDL CHOLESTEROL CAL: 40 mg/dL (ref 5–40)

## 2017-07-16 MED ORDER — CARVEDILOL 12.5 MG PO TABS: 12.5000 mg | ORAL_TABLET | Freq: Two times a day (BID) | ORAL | 3 refills | Status: DC

## 2017-07-16 NOTE — Patient Instructions (Addendum)
Medication Instructions:  Your physician has recommended you make the following change in your medication:  1. STOP the Atorvastatin   Labwork: TODAY:  CMET, CBC, TSH, & LIPID  Testing/Procedures: Your physician has requested that you have an echocardiogram. Echocardiography is a painless test that uses sound waves to create images of your heart. It provides your doctor with information about the size and shape of your heart and how well your heart's chambers and valves are working. This procedure takes approximately one hour. There are no restrictions for this procedure.   Follow-Up: Your physician recommends that you schedule a follow-up appointment in: NEXT AVAILABLE WITH DR. Mayford KnifeURNER FOR SLEEP APNEA   Any Other Special Instructions Will Be Listed Below (If Applicable).   Echocardiogram An echocardiogram, or echocardiography, uses sound waves (ultrasound) to produce an image of your heart. The echocardiogram is simple, painless, obtained within a short period of time, and offers valuable information to your health care provider. The images from an echocardiogram can provide information such as:  Evidence of coronary artery disease (CAD).  Heart size.  Heart muscle function.  Heart valve function.  Aneurysm detection.  Evidence of a past heart attack.  Fluid buildup around the heart.  Heart muscle thickening.  Assess heart valve function.  Tell a health care provider about:  Any allergies you have.  All medicines you are taking, including vitamins, herbs, eye drops, creams, and over-the-counter medicines.  Any problems you or family members have had with anesthetic medicines.  Any blood disorders you have.  Any surgeries you have had.  Any medical conditions you have.  Whether you are pregnant or may be pregnant. What happens before the procedure? No special preparation is needed. Eat and drink normally. What happens during the procedure?  In order to  produce an image of your heart, gel will be applied to your chest and a wand-like tool (transducer) will be moved over your chest. The gel will help transmit the sound waves from the transducer. The sound waves will harmlessly bounce off your heart to allow the heart images to be captured in real-time motion. These images will then be recorded.  You may need an IV to receive a medicine that improves the quality of the pictures. What happens after the procedure? You may return to your normal schedule including diet, activities, and medicines, unless your health care provider tells you otherwise. This information is not intended to replace advice given to you by your health care provider. Make sure you discuss any questions you have with your health care provider. Document Released: 08/18/2000 Document Revised: 04/08/2016 Document Reviewed: 04/28/2013 Elsevier Interactive Patient Education  2017 [employer]   If you need a refill on your cardiac medications before your next appointment, please call your pharmacy. \

## 2017-07-25 ENCOUNTER — Ambulatory Visit: Payer: Self-pay | Admitting: Physician Assistant

## 2017-08-23 ENCOUNTER — Other Ambulatory Visit: Payer: Self-pay

## 2017-08-23 ENCOUNTER — Ambulatory Visit (HOSPITAL_COMMUNITY): Payer: BLUE CROSS/BLUE SHIELD | Attending: Cardiovascular Disease

## 2017-08-23 DIAGNOSIS — I77810 Thoracic aortic ectasia: Secondary | ICD-10-CM | POA: Insufficient documentation

## 2017-08-23 DIAGNOSIS — I5042 Chronic combined systolic (congestive) and diastolic (congestive) heart failure: Secondary | ICD-10-CM | POA: Insufficient documentation

## 2017-08-23 DIAGNOSIS — I071 Rheumatic tricuspid insufficiency: Secondary | ICD-10-CM | POA: Diagnosis not present

## 2017-09-25 ENCOUNTER — Ambulatory Visit: Payer: Self-pay | Admitting: Cardiology

## 2018-02-22 DIAGNOSIS — Z0000 Encounter for general adult medical examination without abnormal findings: Secondary | ICD-10-CM | POA: Diagnosis not present

## 2018-02-22 DIAGNOSIS — I130 Hypertensive heart and chronic kidney disease with heart failure and stage 1 through stage 4 chronic kidney disease, or unspecified chronic kidney disease: Secondary | ICD-10-CM | POA: Diagnosis not present

## 2018-02-22 DIAGNOSIS — M109 Gout, unspecified: Secondary | ICD-10-CM | POA: Diagnosis not present

## 2018-02-22 DIAGNOSIS — Z125 Encounter for screening for malignant neoplasm of prostate: Secondary | ICD-10-CM | POA: Diagnosis not present

## 2018-02-22 DIAGNOSIS — Z13220 Encounter for screening for lipoid disorders: Secondary | ICD-10-CM | POA: Diagnosis not present

## 2019-01-15 ENCOUNTER — Other Ambulatory Visit: Payer: Self-pay

## 2019-01-15 MED ORDER — CARVEDILOL 12.5 MG PO TABS: 12.5000 mg | ORAL_TABLET | Freq: Two times a day (BID) | ORAL | 3 refills | Status: DC

## 2019-08-24 NOTE — Patient Instructions (Addendum)
Medication Instructions:  Your physician recommends that you continue on your current medications as directed. Please refer to the Current Medication list given to you today.  *If you need a refill on your cardiac medications before your next appointment, please call your pharmacy*  Lab Work: None ordered   If you have labs (blood work) drawn today and your tests are completely normal, you will receive your results only by: Marland Kitchen MyChart Message (if you have MyChart) OR . A paper copy in the mail If you have any lab test that is abnormal or we need to change your treatment, we will call you to review the results.  Testing/Procedures: Your physician has requested that you have an echocardiogram. Echocardiography is a painless test that uses sound waves to create images of your heart. It provides your doctor with information about the size and shape of your heart and how well your heart's chambers and valves are working. This procedure takes approximately one hour. There are no restrictions for this procedure.   Follow-Up: At [facility], you and your health needs are our priority.  As part of our continuing mission to provide you with exceptional heart care, we have created designated Provider Care Teams.  These Care Teams include your primary Cardiologist (physician) and Advanced Practice Providers (APPs -  Physician Assistants and Nurse Practitioners) who all work together to provide you with the care you need, when you need it.  Your next appointment:   12 month(s)  The format for your next appointment:   In Person  Provider:   You may see Fransico Him, MD or one of the following Advanced Practice Providers on your designated Care Team:    Melina Copa, PA-C  Ermalinda Barrios, PA-C   Other Instructions  Lifestyle Modifications to Prevent and Treat Heart Disease -Recommend heart healthy/Mediterranean diet, with whole grains, fruits, vegetables, fish, lean meats, nuts, olive oil and  avocado oil.  -Limit salt intake to less than 2000 mg per day.  -Recommend moderate walking, starting slowly with a few minutes and working up to 3-5 times/week for 30-50 minutes each session. Aim for at least 150 minutes.week. Goal should be pace of 3 miles/hours, or walking 1.5 miles in 30 minutes -Recommend avoidance of tobacco products. Avoid excess alcohol. -Keep blood pressure well controlled, ideally less than 130/80.

## 2019-08-24 NOTE — Progress Notes (Addendum)
Cardiology Office Note:    Date:  08/25/2019   ID:  Joel Hammond, DOB 07-Sep-1960, MRN [id number]  PCP:  Lennie Odor, PA-C  Cardiologist:  Fransico Him, MD  Referring MD: Lennie Odor, PA-C   Chief Complaint  Patient presents with  . Follow-up    History of Present Illness:    Joel Hammond is a 58 y.o. male with a past medical history significant for hypertension, severe OSA, chronic combined diastolic/systolic CHF, CKD stage III, prior DVT 2013, hyperlipidemia, mildly dilated aortic root.  The patient works for [employer] and travels a lot for business.  In June 2013, while traveling through [place], he felt ill with a viral infection with H1 N1 and was hospitalized when he arrived in [place].  His hospitalization was complicated by sepsis, respiratory failure and acute renal failure requiring dialysis for several weeks.  He also developed a DVT of his right lower extremity during that hospitalization for which he has been treated with anticoagulation.  He was admitted to [facility] in 04/2012 with new onset CHF with EF 25%.  Nuclear stress test was "possibly abnormal with inferior ischemia although borderline".  Cardiology recommended consideration of outpatient cath but it appears that this was not done.  He transitioned his care to Dr. Radford Pax who felt his cardiomyopathy was possibly viral in nature.  Cardiogram in 04/2015 showed EF 12-45%, grade 1 diastolic dysfunction, mildly dilated ascending aorta, elevated CVP.  Nuclear stress test 04/2015 showed EF 52% otherwise normal.  The patient was last seen 07/16/2017 by Sharrell Ku, PA and was doing well at that time.  He had a follow-up echocardiogram done on 08/23/2017 with EF 55-60%, no regional wall motion abnormalities, normal left ventricular diastolic function parameters.  Ascending aorta was again mildly dilated.  The patient is here today for overdue follow-up. He has not been working since August due  to the pandemic. He gets out and works in his yard. No chest pain/pressure or shortness of breath. No orthopnea, PND, edema, palpitations, lightheadedness or syncope. He has no complaints at all.   His CPAP was "filling him up with air and giving him cramps" so he had to give it up. He has a home sleep study tomorrow through his PCP at [facility]. Review of PCP notes indicate profound wt loss of 87 pounds and he needs to be re-evaluated.   He travels a good bit and is planning to go to [place] in January.    Cardiac studies   Echocardiogram 08/23/2017 Study Conclusions  - Left ventricle: The cavity size was normal. Wall thickness was   increased in a pattern of moderate LVH. Systolic function was   normal. The estimated ejection fraction was in the range of 55%   to 60%. Wall motion was normal; there were no regional wall   motion abnormalities. Left ventricular diastolic function   parameters were normal. - Left atrium: The atrium was mildly dilated. - Atrial septum: No defect or patent foramen ovale was identified.  Past Medical History:  Diagnosis Date  . Aortic root dilatation ([place])   . 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 Chronic combined systolic and diastolic CHF (congestive heart failure) ([place])   . CKD (chronic kidney disease), stage III   . Diastolic dysfunction   . DVT (deep venous thrombosis) ([place]) 2013   Related to being sedentary while hospitalized  .  Hypertension   . NICM (nonischemic cardiomyopathy) (HCC)    a. EF 25% following severe illness in 2013, ? viral cardiomyopathy.  . Pure hypercholesterolemia   . Sleep apnea    Severe    Past Surgical History:  Procedure Laterality Date  . Trauma to finger      Current Medications: Current Meds  Medication Sig  . aspirin 81 MG tablet Take 81 mg by mouth daily.  . carvedilol (COREG) 12.5 MG tablet Take 1  tablet (12.5 mg total) by mouth 2 (two) times daily with a meal.  . IRON PO Take by mouth daily.  . isosorbide mononitrate (IMDUR) 30 MG 24 hr tablet Take 30 mg by mouth daily.  Marland Kitchen. losartan (COZAAR) 25 MG tablet Take 25 mg by mouth daily.  . Multiple Vitamin (MULTIVITAMIN) capsule Take 1 capsule by mouth daily.  . Omega-3 Fatty Acids (FISH OIL) 1000 MG CAPS Take 1 capsule daily by mouth.  . spironolactone (ALDACTONE) 25 MG tablet Take 1 tablet (25 mg total) by mouth daily.     Allergies:   Ace inhibitors and Atorvastatin   Social History   Socioeconomic History  . Marital status: Married    Spouse name: Not on file  . Number of children: Not on file  . Years of education: Not on file  . Highest education level: Not on file  Occupational History  . Not on file  Tobacco Use  . Smoking status: Never Smoker  . Smokeless tobacco: Never Used  Substance and Sexual Activity  . Alcohol use: No  . Drug use: No  . Sexual activity: Not on file  Other Topics Concern  . Not on file  Social History Narrative  . Not on file   Social Determinants of Health   Financial Resource Strain:   . Difficulty of Paying Living Expenses: Not on file  Food Insecurity:   . Worried About Programme researcher, broadcasting/film/videounning Out of Food in the Last Year: Not on file  . Ran Out of Food in the Last Year: Not on file  Transportation Needs:   . Lack of Transportation (Medical): Not on file  . Lack of Transportation (Non-Medical): Not on file  Physical Activity:   . Days of Exercise per Week: Not on file  . Minutes of Exercise per Session: Not on file  Stress:   . Feeling of Stress : Not on file  Social Connections:   . Frequency of Communication with Friends and Family: Not on file  . Frequency of Social Gatherings with Friends and Family: Not on file  . Attends Religious Services: Not on file  . Active Member of Clubs or Organizations: Not on file  . Attends BankerClub or Organization Meetings: Not on file  . Marital Status: Not on  file     Family History: The patient's family history includes Cancer in his father; Hypertension in his father. ROS:   Please see the history of present illness.     All other systems reviewed and are negative.   EKG:  EKG is ordered today.  The ekg ordered today demonstrates NSR, 78 bpm, no changes from previous.   Recent Labs: No results found for requested labs within last 8760 hours.   Recent Lipid Panel    Component Value Date/Time   CHOL 169 07/16/2017 1055   TRIG 199 (H) 07/16/2017 1055   HDL 40 07/16/2017 1055   CHOLHDL 4.2 07/16/2017 1055   CHOLHDL 4.1 02/05/2015 1404   VLDL 45 (H) 02/05/2015 1404  LDLCALC 89 07/16/2017 1055    Physical Exam:    VS:  BP 120/78   Pulse 84   Ht 5\' 7"  (1.702 m)   Wt 225 lb 12.8 oz (102.4 kg)   SpO2 95%   BMI 35.37 kg/m     Wt Readings from Last 6 Encounters:  08/25/19 225 lb 12.8 oz (102.4 kg)  07/16/17 (!) 304 lb 3.2 oz (138 kg)  04/14/15 300 lb (136.1 kg)  04/12/15 (!) 300 lb 6.4 oz (136.3 kg)  02/05/15 (!) 308 lb 3.2 oz (139.8 kg)  08/18/13 297 lb (134.7 kg)     Physical Exam  Constitutional: He is oriented to person, place, and time. He appears well-developed and well-nourished. No distress.  HENT:  Head: Normocephalic and atraumatic.  Neck: No JVD present.  Cardiovascular: Normal rate, regular rhythm, normal heart sounds and intact distal pulses. Exam reveals no gallop and no friction rub.  No murmur heard. Pulmonary/Chest: Effort normal and breath sounds normal. No respiratory distress. He has no wheezes. He has no rales.  Abdominal: Soft. Bowel sounds are normal.  Musculoskeletal:        General: No edema. Normal range of motion.     Cervical back: Normal range of motion and neck supple.  Neurological: He is alert and oriented to person, place, and time.  Skin: Skin is warm and dry.  Psychiatric: He has a normal mood and affect. His behavior is normal. Judgment and thought content normal.  Vitals  reviewed.    ASSESSMENT:    1. Chronic combined systolic and diastolic CHF (congestive heart failure) (HCC)   2. Essential (primary) hypertension   3. Stage 3 chronic kidney disease, unspecified whether stage 3a or 3b CKD   4. OSA (obstructive sleep apnea)   5. Aortic root dilatation (HCC)   6. Cardiomyopathy, unspecified type (HCC)    PLAN:    In order of problems listed above:  Chronic combined CHF/suspected and ICM -Last echocardiogram in 08/2017 with normal EF. -Continues on medical therapy with carvedilol, Imdur, losartan, spironolactone. -Appears euvolemic and is doing well. No activity intolerance.   Hypertension -On carvedilol, Imdur, losartan, spironolactone. -BP well controlled.   CKD stage III -Labs done per PCP at [facility]. 07/29/2019: Scr 1.13, K+ 4.4.   OSA on CPAP -working with his PCP on trying to be able to tolerated CPAP. Has home sleep study planned for tomorrow to re-evaluate OSA after wt loss of 87 pounds.   Mildly dilated ascending aorta -Stable on echo in 08/2017 -Will update echo.     Medication Adjustments/Labs and Tests Ordered: Current medicines are reviewed at length with the patient today.  Concerns regarding medicines are outlined above. Labs and tests ordered and medication changes are outlined in the patient instructions below:  Patient Instructions  Medication Instructions:  Your physician recommends that you continue on your current medications as directed. Please refer to the Current Medication list given to you today.  *If you need a refill on your cardiac medications before your next appointment, please call your pharmacy*  Lab Work: None ordered   If you have labs (blood work) drawn today and your tests are completely normal, you will receive your results only by: 09/2017 MyChart Message (if you have MyChart) OR . A paper copy in the mail If you have any lab test that is abnormal or we need to change your treatment, we will call you to  review the results.  Testing/Procedures: Your physician has requested that you have  an echocardiogram. Echocardiography is a painless test that uses sound waves to create images of your heart. It provides your doctor with information about the size and shape of your heart and how well your heart's chambers and valves are working. This procedure takes approximately one hour. There are no restrictions for this procedure.   Follow-Up: At [facility], you and your health needs are our priority.  As part of our continuing mission to provide you with exceptional heart care, we have created designated Provider Care Teams.  These Care Teams include your primary Cardiologist (physician) and Advanced Practice Providers (APPs -  Physician Assistants and Nurse Practitioners) who all work together to provide you with the care you need, when you need it.  Your next appointment:   12 month(s)  The format for your next appointment:   In Person  Provider:   You may see Armanda Magic, MD or one of the following Advanced Practice Providers on your designated Care Team:    Ronie Spies, PA-C  Jacolyn Reedy, PA-C   Other Instructions  Lifestyle Modifications to Prevent and Treat Heart Disease -Recommend heart healthy/Mediterranean diet, with whole grains, fruits, vegetables, fish, lean meats, nuts, olive oil and avocado oil.  -Limit salt intake to less than 2000 mg per day.  -Recommend moderate walking, starting slowly with a few minutes and working up to 3-5 times/week for 30-50 minutes each session. Aim for at least 150 minutes.week. Goal should be pace of 3 miles/hours, or walking 1.5 miles in 30 minutes -Recommend avoidance of tobacco products. Avoid excess alcohol. -Keep blood pressure well controlled, ideally less than 130/80.      Signed, Berton Bon, NP  08/25/2019 10:23 AM    [facility]

## 2019-08-25 ENCOUNTER — Encounter: Payer: Self-pay | Admitting: Cardiology

## 2019-08-25 ENCOUNTER — Ambulatory Visit: Payer: BLUE CROSS/BLUE SHIELD | Admitting: Cardiology

## 2019-08-25 ENCOUNTER — Other Ambulatory Visit: Payer: Self-pay

## 2019-08-25 VITALS — BP 120/78 | HR 84 | Ht 67.0 in | Wt 225.8 lb

## 2019-08-25 DIAGNOSIS — N183 Chronic kidney disease, stage 3 unspecified: Secondary | ICD-10-CM | POA: Diagnosis not present

## 2019-08-25 DIAGNOSIS — I5042 Chronic combined systolic (congestive) and diastolic (congestive) heart failure: Secondary | ICD-10-CM | POA: Diagnosis not present

## 2019-08-25 DIAGNOSIS — I429 Cardiomyopathy, unspecified: Secondary | ICD-10-CM | POA: Diagnosis not present

## 2019-08-25 DIAGNOSIS — G4733 Obstructive sleep apnea (adult) (pediatric): Secondary | ICD-10-CM

## 2019-08-25 DIAGNOSIS — I10 Essential (primary) hypertension: Secondary | ICD-10-CM

## 2019-08-25 DIAGNOSIS — I77810 Thoracic aortic ectasia: Secondary | ICD-10-CM

## 2019-09-03 ENCOUNTER — Ambulatory Visit (HOSPITAL_COMMUNITY): Payer: Managed Care, Other (non HMO) | Attending: Cardiology

## 2019-09-03 ENCOUNTER — Other Ambulatory Visit: Payer: Self-pay

## 2019-09-03 DIAGNOSIS — I429 Cardiomyopathy, unspecified: Secondary | ICD-10-CM | POA: Insufficient documentation

## 2019-09-03 DIAGNOSIS — I77810 Thoracic aortic ectasia: Secondary | ICD-10-CM | POA: Insufficient documentation

## 2020-03-10 ENCOUNTER — Telehealth: Payer: Self-pay

## 2020-03-10 NOTE — Telephone Encounter (Signed)
NOTES ON FILE FROM  NOELLE REDMON, SENT REFERRAL TO SCHEDULING

## 2020-03-10 NOTE — Telephone Encounter (Signed)
NOTES ON FILE FROM  NOELLE REDMON PA , SENT REFERRAL TO SCHEDULING

## 2020-08-30 ENCOUNTER — Ambulatory Visit: Payer: Managed Care, Other (non HMO) | Admitting: Cardiology

## 2020-12-06 ENCOUNTER — Ambulatory Visit: Payer: Managed Care, Other (non HMO) | Admitting: Cardiology

## 2021-02-10 ENCOUNTER — Encounter: Payer: Self-pay | Admitting: Cardiology

## 2021-02-10 ENCOUNTER — Other Ambulatory Visit: Payer: Self-pay

## 2021-02-10 ENCOUNTER — Ambulatory Visit: Payer: Managed Care, Other (non HMO) | Admitting: Cardiology

## 2021-02-10 VITALS — BP 162/114 | HR 106 | Ht 66.0 in | Wt 291.2 lb

## 2021-02-10 DIAGNOSIS — I77819 Aortic ectasia, unspecified site: Secondary | ICD-10-CM

## 2021-02-10 DIAGNOSIS — I5042 Chronic combined systolic (congestive) and diastolic (congestive) heart failure: Secondary | ICD-10-CM

## 2021-02-10 DIAGNOSIS — I10 Essential (primary) hypertension: Secondary | ICD-10-CM

## 2021-02-10 DIAGNOSIS — N183 Chronic kidney disease, stage 3 unspecified: Secondary | ICD-10-CM

## 2021-02-10 DIAGNOSIS — G4733 Obstructive sleep apnea (adult) (pediatric): Secondary | ICD-10-CM

## 2021-02-10 DIAGNOSIS — I420 Dilated cardiomyopathy: Secondary | ICD-10-CM

## 2021-02-10 MED ORDER — CARVEDILOL 12.5 MG PO TABS: 12.5000 mg | ORAL_TABLET | Freq: Two times a day (BID) | ORAL | 3 refills | Status: DC

## 2021-02-10 MED ORDER — ISOSORBIDE MONONITRATE ER 30 MG PO TB24: 30.0000 mg | ORAL_TABLET | Freq: Every day | ORAL | 3 refills | Status: DC

## 2021-02-10 MED ORDER — SPIRONOLACTONE 25 MG PO TABS: 25.0000 mg | ORAL_TABLET | Freq: Every day | ORAL | 6 refills | Status: DC

## 2021-02-10 MED ORDER — LOSARTAN POTASSIUM 25 MG PO TABS: 25.0000 mg | ORAL_TABLET | Freq: Every day | ORAL | 3 refills | Status: DC

## 2021-02-10 NOTE — Patient Instructions (Signed)
Medication Instructions:  Your physician recommends that you continue on your current medications as directed. Please refer to the Current Medication list given to you today.  *If you need a refill on your cardiac medications before your next appointment, please call your pharmacy*   Lab Work: TODAY: BMET If you have labs (blood work) drawn today and your tests are completely normal, you will receive your results only by: MyChart Message (if you have MyChart) OR A paper copy in the mail If you have any lab test that is abnormal or we need to change your treatment, we will call you to review the results.   Follow-Up: At [facility], you and your health needs are our priority.  As part of our continuing mission to provide you with exceptional heart care, we have created designated Provider Care Teams.  These Care Teams include your primary Cardiologist (physician) and Advanced Practice Providers (APPs -  Physician Assistants and Nurse Practitioners) who all work together to provide you with the care you need, when you need it.   Your next appointment:   1 year(s)  The format for your next appointment:   In Person  Provider:   You may see Traci Turner, MD or one of the following Advanced Practice Providers on your designated Care Team:   Dayna Dunn, PA-C Michele Lenze, PA-C   

## 2021-02-10 NOTE — Progress Notes (Signed)
Cardiology Office Note:    Date:  02/10/2021   ID:  Joel Hammond, DOB 1961/06/06, MRN [id number]  PCP:  Milus Height, PA  Cardiologist:  Armanda Magic, MD  Referring MD: Milus Height, PA   No chief complaint on file.   History of Present Illness:    Joel Hammond is a 60 y.o. male with a past medical history significant for hypertension, severe OSA, chronic combined diastolic/systolic CHF, CKD stage III, prior DVT 2013, hyperlipidemia, mildly dilated aortic root.  The patient works for [employer] and travels a lot for business.  In June 2013, while traveling through [place], he felt ill with a viral infection with H1 N1 and was hospitalized when he arrived in [place].  His hospitalization was complicated by sepsis, respiratory failure and acute renal failure requiring dialysis for several weeks.  He also developed a DVT of his right lower extremity during that hospitalization for which he has been treated with anticoagulation.  He was admitted to [facility] in 04/2012 with new onset CHF with EF 25%.  Nuclear stress test was "possibly abnormal with inferior ischemia although borderline".  Cardiology recommended consideration of outpatient cath but it appears that this was not done.  He transitioned his care to me and I thought that his cardiomyopathy was possibly viral in nature.  2D echo in 04/2015 showed EF 55-60%, grade 1 diastolic dysfunction, mildly dilated ascending aorta, elevated RAP.  Nuclear stress test 04/2015 showed EF 52% otherwise normal.  He had a follow-up echocardiogram done on 08/23/2017 with EF 55-60%, no regional wall motion abnormalities, normal left ventricular diastolic function parameters.  Ascending aorta was again mildly dilated.  He was last seen by my P in Dec 2020 and was doing well on medical therapy for his HF.  He has OSA and has been on CPAP but had not tolerated it.  He underwent home sleep study at [facility] with an AHI of 17/hr and  was told he did not need his CPAP any longer and stopped using it.  He then gained weight during COVID 19 and restarted it.    He is doing well with his auto CPAP device and thinks that he has gotten used to it.  he tolerates the mask and feels the pressure is adequate.  Since going on CPAP he feels rested in the am and has no significant daytime sleepiness.  He denies any significant mouth or nasal dryness or nasal congestion.  He does not think that he snores.     He is here today for followup and is doing well.  He denies any chest pain or pressure, SOB, DOE, PND, orthopnea, LE edema, dizziness, palpitations or syncope. He is compliant with his meds and is tolerating meds with no SE.      Cardiac studies   Echocardiogram 08/23/2017 Study Conclusions   - Left ventricle: The cavity size was normal. Wall thickness was   increased in a pattern of moderate LVH. Systolic function was   normal. The estimated ejection fraction was in the range of 55%   to 60%. Wall motion was normal; there were no regional wall   motion abnormalities. Left ventricular diastolic function   parameters were normal. - Left atrium: The atrium was mildly dilated. - Atrial septum: No defect or patent foramen ovale was identified.  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 EF 25% following severe illness in 2013, ? viral cardiomyopathy.   Pure hypercholesterolemia    Sleep apnea    Severe    Past Surgical History:  Procedure  Laterality Date   Trauma to finger      Current Medications: Current Meds  Medication Sig   carvedilol (COREG) 12.5 MG tablet Take 1 tablet (12.5 mg total) by mouth 2 (two) times daily with a meal.   IRON PO Take by mouth daily.   isosorbide mononitrate (IMDUR) 30 MG 24 hr tablet Take 30 mg by mouth daily.   losartan (COZAAR) 25 MG tablet Take 25 mg by mouth daily.   Multiple Vitamin (MULTIVITAMIN) capsule Take 1 capsule by mouth daily.   Omega-3 Fatty Acids (FISH OIL) 1000 MG CAPS Take 1 capsule daily by mouth.   spironolactone (ALDACTONE) 25 MG tablet Take 1 tablet (25 mg total) by mouth daily.     Allergies:   Ace inhibitors and Atorvastatin   Social History   Socioeconomic History   Marital status: Married    Spouse name: Not on file   Number of children: Not on file   Years of education: Not on file   Highest education level: Not on file  Occupational History   Not on file  Tobacco Use   Smoking status: Never   Smokeless tobacco: Never  Vaping Use   Vaping Use: Never used  Substance and Sexual Activity   Alcohol use: No   Drug use: No   Sexual activity: Not on file  Other Topics Concern   Not on file  Social History Narrative   Not on file   Social Determinants of Health   Financial Resource Strain: Not on file  Food Insecurity: Not on file  Transportation Needs: Not on file  Physical Activity: Not on file  Stress: Not on file  Social Connections: Not on file     Family History: The patient's family history includes Cancer in his father; Hypertension in his father. ROS:   Please see the history of present illness.     All other systems reviewed and are negative.   EKG:  EKG is ordered today.  The ekg ordered today demonstrates NSR, 78 bpm, no changes from previous.   Recent Labs: No results found for requested labs within last 8760 hours.   Recent Lipid Panel    Component Value Date/Time   CHOL 169 07/16/2017 1055   TRIG 199 (H) 07/16/2017 1055    HDL 40 07/16/2017 1055   CHOLHDL 4.2 07/16/2017 1055   CHOLHDL 4.1 02/05/2015 1404   VLDL 45 (H) 02/05/2015 1404   LDLCALC 89 07/16/2017 1055    Physical Exam:    VS:  BP (!) 162/114   Pulse (!) 106   Ht 5\' 6"  (1.676 m)   Wt 291 lb 3.2 oz (132.1 kg)   SpO2 95%   BMI 47.00 kg/m     Wt Readings from Last 6 Encounters:  02/10/21 291 lb 3.2 oz (132.1 kg)  08/25/19 225 lb 12.8 oz (102.4 kg)  07/16/17 (!) 304 lb 3.2 oz (138  kg)  04/14/15 300 lb (136.1 kg)  04/12/15 (!) 300 lb 6.4 oz (136.3 kg)  02/05/15 (!) 308 lb 3.2 oz (139.8 kg)    GEN: Well nourished, well developed in no acute distress HEENT: Normal NECK: No JVD; No carotid bruits LYMPHATICS: No lymphadenopathy CARDIAC:RRR, no murmurs, rubs, gallops RESPIRATORY:  Clear to auscultation without rales, wheezing or rhonchi  ABDOMEN: Soft, non-tender, non-distended MUSCULOSKELETAL:  No edema; No deformity  SKIN: Warm and dry NEUROLOGIC:  Alert and oriented x 3 PSYCHIATRIC:  Normal affect    EKG was performed today and showed sinus tachycardia at 106bpm  ASSESSMENT:    1. Chronic combined systolic and diastolic CHF (congestive heart failure) (HCC)   2. Dilated cardiomyopathy (HCC)   3. Primary hypertension   4. Stage 3 chronic kidney disease, unspecified whether stage 3a or 3b CKD (HCC)   5. OSA (obstructive sleep apnea)   6. Acquired dilation of ascending aorta and aortic root (HCC)    PLAN:    In order of problems listed above:  Chronic combined CHF/suspected and ICM -Last echocardiogram in 08/2019 with normal EF at 60-65% -he appears euvolemic on exam today -Continue prescription drug management with Carvedilol 12.5mg  BID, Imdur 30mg  daily, Losartan 25mg  daily and spiro 25mg  daily >>refilled for 1 year -I have personally reviewed and interpreted outside labs performed by patient's PCP which showed SCr 1.18, K+ 4.7 in July 2021 -repeat BMET   Hypertension -Bp markedly elevated today but he is very anxious and  upset today -I will get a 48 hour BP monitor to assess BP control -Continue prescription drug management with Carvedilol 12.5mg  BID, Imdur 30mg  daily, losartan 25mg  daily and spiro 25mg  daily>>refilled today  CKD stage III -stage 3a -SCr in July 2021 was 1.18 -repeat BMET  OSA on CPAP -he had a home sleep study done with [facility] and had an AHI fo 17/hr and was told that he did not need to use his PAP anymore after losing weight -unfortunately he has gained back weight and restarted his CPAP -I will get a download from his DME to make sure his AHI is stable   Mildly dilated ascending aorta -4.3cm on echo 08/2019 -will get chest gated CTA to assess further -needs much better control of BP    Medication Adjustments/Labs and Tests Ordered: Current medicines are reviewed at length with the patient today.  Concerns regarding medicines are outlined above. Labs and tests ordered and medication changes are outlined in the patient instructions below:  There are no Patient Instructions on file for this visit.    Signed, August 2021, MD  02/10/2021 4:03 PM     [facility]

## 2021-02-11 ENCOUNTER — Telehealth: Payer: Self-pay | Admitting: Cardiology

## 2021-02-11 DIAGNOSIS — G4733 Obstructive sleep apnea (adult) (pediatric): Secondary | ICD-10-CM

## 2021-02-11 LAB — BASIC METABOLIC PANEL
BUN/Creatinine Ratio: 11 (ref 9–20)
BUN: 13 mg/dL (ref 6–24)
CO2: 22 mmol/L (ref 20–29)
Calcium: 9.7 mg/dL (ref 8.7–10.2)
Chloride: 106 mmol/L (ref 96–106)
Creatinine, Ser: 1.21 mg/dL (ref 0.76–1.27)
Glucose: 102 mg/dL — ABNORMAL HIGH (ref 65–99)
Potassium: 4.4 mmol/L (ref 3.5–5.2)
Sodium: 143 mmol/L (ref 134–144)
eGFR: 70 mL/min/{1.73_m2} (ref >59)

## 2021-02-11 NOTE — Telephone Encounter (Signed)
Pt. Is returning a call in regards to getting new head gear supplies for his CPAP machine. Please advise

## 2021-02-16 NOTE — Telephone Encounter (Signed)
Order placed to [facility] via community message 

## 2021-02-25 ENCOUNTER — Telehealth: Payer: Self-pay | Admitting: *Deleted

## 2021-02-25 NOTE — Telephone Encounter (Signed)
-----   Message from Quintella Reichert, MD sent at 02/15/2021 10:30 AM EDT ----- Good AHI and compliance.  Continue current PAP settings.

## 2021-02-25 NOTE — Telephone Encounter (Signed)
Patient understands his AHI showed normal at ... Pt is aware and agreeable to normal results.

## 2022-03-08 ENCOUNTER — Encounter: Payer: Self-pay | Admitting: Cardiology

## 2022-03-08 ENCOUNTER — Ambulatory Visit: Payer: BC Managed Care – PPO | Admitting: Cardiology

## 2022-03-08 VITALS — BP 132/96 | HR 73 | Temp 98.3°F | Resp 16 | Ht 66.0 in | Wt 304.0 lb

## 2022-03-08 DIAGNOSIS — E7800 Pure hypercholesterolemia, unspecified: Secondary | ICD-10-CM | POA: Diagnosis not present

## 2022-03-08 DIAGNOSIS — I77810 Thoracic aortic ectasia: Secondary | ICD-10-CM | POA: Diagnosis not present

## 2022-03-08 DIAGNOSIS — I130 Hypertensive heart and chronic kidney disease with heart failure and stage 1 through stage 4 chronic kidney disease, or unspecified chronic kidney disease: Secondary | ICD-10-CM | POA: Diagnosis not present

## 2022-03-08 DIAGNOSIS — I509 Heart failure, unspecified: Secondary | ICD-10-CM | POA: Diagnosis not present

## 2022-03-08 DIAGNOSIS — Z0000 Encounter for general adult medical examination without abnormal findings: Secondary | ICD-10-CM | POA: Diagnosis not present

## 2022-03-08 DIAGNOSIS — I5032 Chronic diastolic (congestive) heart failure: Secondary | ICD-10-CM | POA: Diagnosis not present

## 2022-03-08 DIAGNOSIS — G4733 Obstructive sleep apnea (adult) (pediatric): Secondary | ICD-10-CM

## 2022-03-08 DIAGNOSIS — Z9989 Dependence on other enabling machines and devices: Secondary | ICD-10-CM

## 2022-03-08 DIAGNOSIS — M109 Gout, unspecified: Secondary | ICD-10-CM | POA: Diagnosis not present

## 2022-03-08 DIAGNOSIS — G4730 Sleep apnea, unspecified: Secondary | ICD-10-CM | POA: Diagnosis not present

## 2022-03-08 DIAGNOSIS — Z720 Tobacco use: Secondary | ICD-10-CM

## 2022-03-08 DIAGNOSIS — N183 Chronic kidney disease, stage 3 unspecified: Secondary | ICD-10-CM | POA: Insufficient documentation

## 2022-03-08 DIAGNOSIS — I10 Essential (primary) hypertension: Secondary | ICD-10-CM

## 2022-03-08 DIAGNOSIS — Z6841 Body Mass Index (BMI) 40.0 and over, adult: Secondary | ICD-10-CM

## 2022-03-08 MED ORDER — CARVEDILOL 25 MG PO TABS: 25.0000 mg | ORAL_TABLET | Freq: Two times a day (BID) | ORAL | 3 refills | Status: DC

## 2022-03-08 MED ORDER — LOSARTAN POTASSIUM 50 MG PO TABS: 50.0000 mg | ORAL_TABLET | Freq: Every day | ORAL | 2 refills | Status: DC

## 2022-03-08 NOTE — Progress Notes (Signed)
Primary Physician/Referring:  Milus Height, PA  Patient ID: Joel Hammond, male    DOB: 1960/12/02, 61 y.o.   MRN: [id number]  Chief Complaint  Patient presents with   Congestive Heart Failure   New Patient (Initial Visit)   HPI:    Joel Hammond  is a 61 y.o. Male patient with hypertension, hyperlipidemia, chronic diastolic heart failure, severe OSA on CPAP, mildly dilated aortic root presents here to establish care, previously followed by [facility]. Last known echocardiogram from 20 and also in 2020 had revealed LVEF to be normal with grade 1 diastolic dysfunction improved dilatation at 43 mm.  Patient was diagnosed with H1 N1 flu while he was in [place] and was med evacuated directly to [facility].  He was diagnosed with nonischemic cardiomyopathy, acute renal failure, septic shock.  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  He also had DVT at that time.  He wanted to establish with me, he has no specific complaints, admits to being markedly sedentary.  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 EF 25% following severe illness in 2013, ? viral cardiomyopathy.   Pure hypercholesterolemia    Past Surgical History:  Procedure Laterality Date   Trauma to finger     Family History  Problem Relation Age of Onset   Cancer Father    Hypertension Father     Social History   Tobacco Use   Smoking status: Never   Smokeless tobacco: Current    Types: Snuff  Substance Use Topics   Alcohol use: Yes    Comment: occ   Marital  Status: Married  ROS  Review of Systems  Cardiovascular:  Negative for chest pain, dyspnea on exertion and leg swelling.  Respiratory:  Positive for snoring (on CPAP).    Objective  Blood pressure (!) 132/96, pulse 73, temperature 98.3 F (36.8 C), resp. rate 16, height 5\' 6"  (1.676 m), weight (!) 304 lb (137.9 kg), SpO2 95 %. Body mass index is 49.07 kg/m.     03/08/2022   12:03 PM 03/08/2022   11:30 AM 03/08/2022   11:23 AM  Vitals with BMI  Height   5\' 6"   Weight   304 lbs  BMI   49.09  Systolic 132 143 410  Diastolic 96 93 94  Pulse  73 74    Physical Exam Constitutional:      Comments: Morbidly obese in no acute distress.  Neck:     Vascular: No carotid bruit or JVD.  Cardiovascular:     Rate and Rhythm: Normal rate and regular rhythm.     Pulses: Intact distal pulses.          Carotid pulses are 2+ on the right side and 2+ on the left side.    Heart sounds: Normal heart sounds. No murmur heard.    No gallop.  Pulmonary:     Effort: Pulmonary effort is normal.     Breath sounds: Normal breath sounds.  Abdominal:     General: Bowel sounds are normal.  Palpations: Abdomen is soft.     Comments: Obese. Pannus present  Musculoskeletal:     Right lower leg: No edema.     Left lower leg: No edema.     Medications and allergies   Allergies  Allergen Reactions   Ace Inhibitors Cough   Atorvastatin Other (See Comments)     Medication list after today's encounter   Current Outpatient Medications:    IRON PO, Take by mouth daily., Disp: , Rfl:    Multiple Vitamin (MULTIVITAMIN) capsule, Take 1 capsule by mouth daily., Disp: , Rfl:    Omega-3 Fatty Acids (FISH OIL) 1000 MG CAPS, Take 1 capsule daily by mouth., Disp: , Rfl:    spironolactone (ALDACTONE) 25 MG tablet, Take 1 tablet (25 mg total) by mouth daily., Disp: 30 tablet, Rfl: 6   carvedilol (COREG) 25 MG tablet, Take 1 tablet (25 mg total) by mouth 2 (two) times daily with a meal., Disp: 180 tablet, Rfl: 3    losartan (COZAAR) 50 MG tablet, Take 1 tablet (50 mg total) by mouth daily., Disp: 30 tablet, Rfl: 2  Laboratory examination:   External labs:   Pending  Radiology:    Cardiac Studies:   Lexiscan Nuclear stress test 02/13/2015: The left ventricular ejection fraction is mildly decreased (45-54%). Nuclear stress EF: 52%. The study is normal. This is a low risk study.  Echocardiogram 09/02/2021:   1. Left ventricular ejection fraction, by visual estimation, is 60 to 65%. The left ventricle has normal function. There is mildly increased left ventricular hypertrophy. The average left ventricular global longitudinal strain is -17.2 %.  2. Left ventricular diastolic parameters are consistent with Grade I diastolic dysfunction (impaired relaxation).  3. The left ventricle has no regional wall motion abnormalities.  4. Global right ventricle has normal systolic function.The right ventricular size is normal. No increase in right ventricular wall thickness.  5. The aortic valve is normal in structure. Aortic valve regurgitation is not visualized. Mild to moderate aortic valve sclerosis/calcification without any evidence of aortic stenosis.  6. There is mild dilatation of the ascending aorta measuring 43 mm.  7. There has been no significant change since the prior study on 08/23/2017.  EKG:   EKG 03/08/2022: Normal sinus rhythm at the rate of 74 bpm, LAFB, incomplete right bundle branch block.  Poor R wave progression, probably normal variant.  No evidence of ischemia, normal QT interval.    Assessment     ICD-10-CM   1. Chronic diastolic (congestive) heart failure (HCC)  I50.32 EKG 12-Lead    PCV ECHOCARDIOGRAM COMPLETE    CT CARDIAC SCORING ([facility] LOCATIONS ONLY)    losartan (COZAAR) 50 MG tablet    carvedilol (COREG) 25 MG tablet    2. Aortic root dilation (HCC)  I77.810 CT CARDIAC SCORING ([facility] LOCATIONS ONLY)    losartan (COZAAR) 50 MG tablet    3. Primary hypertension  I10     4.  OSA on CPAP  G47.33    Z99.89     5. Smokeless tobacco use  Z72.0     6. Class 3 severe obesity due to excess calories without serious comorbidity with body mass index (BMI) of 45.0 to 49.9 in adult [facility])  E66.01    Z68.42        Medications Discontinued During This Encounter  Medication Reason   isosorbide mononitrate (IMDUR) 30 MG 24 hr tablet Discontinued by provider   carvedilol (COREG) 12.5 MG tablet    losartan (COZAAR) 25 MG  tablet     Meds ordered this encounter  Medications   losartan (COZAAR) 50 MG tablet    Sig: Take 1 tablet (50 mg total) by mouth daily.    Dispense:  30 tablet    Refill:  2   carvedilol (COREG) 25 MG tablet    Sig: Take 1 tablet (25 mg total) by mouth 2 (two) times daily with a meal.    Dispense:  180 tablet    Refill:  3   Orders Placed This Encounter  Procedures   CT CARDIAC SCORING ([facility] LOCATIONS ONLY)    Standing Status:   Future    Standing Expiration Date:   05/09/2022    Order Specific Question:   Preferred imaging location?    Answer:   [facility]   EKG 12-Lead   PCV ECHOCARDIOGRAM COMPLETE    Standing Status:   Future    Standing Expiration Date:   03/09/2023   Recommendations:   JOSHIA KITCHINGS is a 61 y.o.  Male patient with hypertension, hyperlipidemia, chronic diastolic heart failure, severe OSA on CPAP, mildly dilated aortic root presents here to establish care, previously followed by [facility]. Last known echocardiogram from 20 and also in 2020 had revealed LVEF to be normal with grade 1 diastolic dysfunction improved dilatation at 43 mm.  Patient was diagnosed with H1 N1 flu while he was in [place] and was med evacuated directly to [facility].  He was diagnosed with nonischemic cardiomyopathy, acute renal failure, septic shock.  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  He also had DVT at that time.  He wanted to establish with  me, he has no specific complaints, admits to being markedly sedentary.  He is working for a [employer] and travels a lot and hardly in [place].  He admits to eating excessively as well.  His blood pressure was markedly elevated today even after checking several times.  His blood pressure was normal at PCPs office.  However in view of chronic diastolic heart failure, aortic root dilatation have taken the liberty of increasing the dose of carvedilol from 12.5 mg to 25 mg twice daily and increase losartan to 50 mg daily.  Previously he had acute renal failure but however his recent evaluation of his labs from 2021 and also in 2022 revealed complete normalization of his renal function.  From cardiac standpoint, he has discontinued taking statins as atorvastatin gave him skin rash.  He has a lipid profile pending from today.  I will obtain coronary calcium scoring.  We will repeat echocardiogram to follow-up on aortic root dilatation and diastolic heart failure.  I would like to see him back in 5 to 6 weeks for follow-up.  I also encouraged him to make an appointment to see Dr. Theressa Millard for follow-up on his sleep apnea.  He has been compliant with CPAP.    Yates Decamp, MD, [facility] 03/08/2022, 12:19 PM Office: [phone]

## 2022-03-14 DIAGNOSIS — C44319 Basal cell carcinoma of skin of other parts of face: Secondary | ICD-10-CM | POA: Diagnosis not present

## 2022-03-14 DIAGNOSIS — N289 Disorder of kidney and ureter, unspecified: Secondary | ICD-10-CM | POA: Diagnosis not present

## 2022-03-14 DIAGNOSIS — L989 Disorder of the skin and subcutaneous tissue, unspecified: Secondary | ICD-10-CM | POA: Diagnosis not present

## 2022-03-14 DIAGNOSIS — R739 Hyperglycemia, unspecified: Secondary | ICD-10-CM | POA: Diagnosis not present

## 2022-03-15 ENCOUNTER — Ambulatory Visit
Admission: RE | Admit: 2022-03-15 | Discharge: 2022-03-15 | Disposition: A | Discharge status: Home / Self Care | Payer: BC Managed Care – PPO | Source: Ambulatory Visit | Attending: Cardiology | Admitting: Cardiology

## 2022-03-15 DIAGNOSIS — I77810 Thoracic aortic ectasia: Secondary | ICD-10-CM

## 2022-03-15 DIAGNOSIS — I5032 Chronic diastolic (congestive) heart failure: Secondary | ICD-10-CM

## 2022-03-15 DIAGNOSIS — I700 Atherosclerosis of aorta: Secondary | ICD-10-CM | POA: Diagnosis not present

## 2022-03-17 NOTE — Progress Notes (Signed)
Coronary calcium score 03/17/2022: LM: 34 LAD 568 LCx 78 RCA 3 Total Agatston score 883. MESA database percentile: 97th Ascending aorta 4.1 cm, descending aorta 2.9 cm.  Aortic calcification are noted in the thoracic aorta.  Ectatic ascending aorta.  No other significant extracardiac abnormality.

## 2022-03-29 DIAGNOSIS — C44319 Basal cell carcinoma of skin of other parts of face: Secondary | ICD-10-CM | POA: Diagnosis not present

## 2022-04-04 DIAGNOSIS — E668 Other obesity: Secondary | ICD-10-CM | POA: Diagnosis not present

## 2022-04-04 DIAGNOSIS — I10 Essential (primary) hypertension: Secondary | ICD-10-CM | POA: Diagnosis not present

## 2022-04-04 DIAGNOSIS — L255 Unspecified contact dermatitis due to plants, except food: Secondary | ICD-10-CM | POA: Diagnosis not present

## 2022-04-07 DIAGNOSIS — C44319 Basal cell carcinoma of skin of other parts of face: Secondary | ICD-10-CM | POA: Diagnosis not present

## 2022-04-10 ENCOUNTER — Other Ambulatory Visit: Payer: Self-pay | Admitting: Cardiology

## 2022-04-10 DIAGNOSIS — I5032 Chronic diastolic (congestive) heart failure: Secondary | ICD-10-CM

## 2022-04-10 DIAGNOSIS — I77810 Thoracic aortic ectasia: Secondary | ICD-10-CM

## 2022-04-12 ENCOUNTER — Ambulatory Visit: Payer: BC Managed Care – PPO

## 2022-04-12 DIAGNOSIS — I5032 Chronic diastolic (congestive) heart failure: Secondary | ICD-10-CM | POA: Diagnosis not present

## 2022-04-19 ENCOUNTER — Other Ambulatory Visit: Payer: Self-pay | Admitting: Cardiology

## 2022-04-27 ENCOUNTER — Ambulatory Visit: Payer: BC Managed Care – PPO | Admitting: Cardiology

## 2022-04-27 ENCOUNTER — Encounter: Payer: Self-pay | Admitting: Cardiology

## 2022-04-27 VITALS — BP 139/78 | HR 71 | Temp 97.5°F | Resp 16 | Ht 66.0 in | Wt 304.6 lb

## 2022-04-27 DIAGNOSIS — R931 Abnormal findings on diagnostic imaging of heart and coronary circulation: Secondary | ICD-10-CM

## 2022-04-27 DIAGNOSIS — I5032 Chronic diastolic (congestive) heart failure: Secondary | ICD-10-CM

## 2022-04-27 DIAGNOSIS — E782 Mixed hyperlipidemia: Secondary | ICD-10-CM

## 2022-04-27 DIAGNOSIS — I2510 Atherosclerotic heart disease of native coronary artery without angina pectoris: Secondary | ICD-10-CM | POA: Diagnosis not present

## 2022-04-27 DIAGNOSIS — I77810 Thoracic aortic ectasia: Secondary | ICD-10-CM

## 2022-04-27 DIAGNOSIS — I10 Essential (primary) hypertension: Secondary | ICD-10-CM

## 2022-04-27 MED ORDER — CARVEDILOL 25 MG PO TABS: 25.0000 mg | ORAL_TABLET | Freq: Two times a day (BID) | ORAL | 3 refills | Status: AC

## 2022-04-27 MED ORDER — EZETIMIBE 10 MG PO TABS: 10.0000 mg | ORAL_TABLET | Freq: Every day | ORAL | 3 refills | Status: AC

## 2022-04-27 MED ORDER — ROSUVASTATIN CALCIUM 20 MG PO TABS: 20.0000 mg | ORAL_TABLET | Freq: Every day | ORAL | 3 refills | Status: AC

## 2022-04-27 MED ORDER — LOSARTAN POTASSIUM 50 MG PO TABS: 50.0000 mg | ORAL_TABLET | Freq: Every evening | ORAL | 3 refills | Status: AC

## 2022-04-27 MED ORDER — SPIRONOLACTONE 25 MG PO TABS: 25.0000 mg | ORAL_TABLET | ORAL | 3 refills | Status: AC

## 2022-04-27 NOTE — Progress Notes (Signed)
Primary Physician/Referring:  Milus Height, PA  Patient ID: Joel Hammond, male    DOB: Aug 29, 1961, 61 y.o.   MRN: [id number]  No chief complaint on file.  HPI:    Joel Hammond  is a 61 y.o. Male patient with hypertension, hyperlipidemia, chronic diastolic heart failure, severe OSA on CPAP, mildly dilated aortic root, sedentary lifestyle, history of diagnosis of H1 N1 flu in 2013 while in [place] and had developed severe viral cardiomyopathy.  He was seen by me 6 weeks ago, I did increase the dose of carvedilol to 25 mg twice daily for hypertension and also increased losartan from 25 to 50 mg daily for aortic root dilatation, he underwent echocardiogram and coronary CT calcium score and presents for follow-up.  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 EF 25% following severe illness in 2013, ? viral cardiomyopathy.   Pure hypercholesterolemia    Past Surgical History:  Procedure Laterality Date   Trauma to finger     Family History  Problem Relation Age of Onset   Cancer Father    Hypertension Father     Social History   Tobacco Use   Smoking status: Never   Smokeless tobacco: Current    Types: Snuff  Substance Use Topics   Alcohol use: Yes    Comment: occ   Marital Status: Married  ROS  Review of Systems  Cardiovascular:  Negative for chest pain, dyspnea on exertion and leg swelling.  Respiratory:  Positive for snoring (on CPAP).    Objective  Blood pressure 139/78, pulse 71, temperature (!) 97.5 F (36.4 C), temperature source Temporal, resp. rate 16, height 5\' 6"  (1.676 m), weight (!) 304 lb 9.6 oz  (138.2 kg), SpO2 95 %. Body mass index is 49.16 kg/m.     04/27/2022    3:21 PM 03/08/2022   12:03 PM 03/08/2022   11:30 AM  Vitals with BMI  Height 5\' 6"     Weight 304 lbs 10 oz    BMI 49.19    Systolic 139 132 05/09/2022  Diastolic 78 96 93  Pulse 71  73    Physical Exam Constitutional:      Comments: Morbidly obese in no acute distress.  Neck:     Vascular: No carotid bruit or JVD.  Cardiovascular:     Rate and Rhythm: Normal rate and regular rhythm.     Pulses: Intact distal pulses.          Carotid pulses are 2+ on the right side and 2+ on the left side.    Heart sounds: Normal heart sounds. No murmur heard.    No gallop.  Pulmonary:     Effort: Pulmonary effort is normal.     Breath sounds: Normal breath sounds.  Abdominal:     General: Bowel sounds are normal.     Palpations: Abdomen is soft.     Comments: Obese. Pannus present  Musculoskeletal:     Right lower leg: No edema.     Left lower leg: No edema.     Medications and allergies   Allergies  Allergen Reactions   Ace  Inhibitors Cough   Atorvastatin Other (See Comments)     Medication list after today's encounter   Current Outpatient Medications:    allopurinol (ZYLOPRIM) 100 MG tablet, Take 1 tablet by mouth daily., Disp: , Rfl:    ezetimibe (ZETIA) 10 MG tablet, Take 1 tablet (10 mg total) by mouth daily., Disp: 90 tablet, Rfl: 3   IRON PO, Take by mouth daily., Disp: , Rfl:    losartan (COZAAR) 50 MG tablet, Take 50 mg by mouth daily., Disp: , Rfl:    Multiple Vitamin (MULTIVITAMIN) capsule, Take 1 capsule by mouth daily., Disp: , Rfl:    Omega-3 Fatty Acids (FISH OIL) 1000 MG CAPS, Take 1 capsule daily by mouth., Disp: , Rfl:    carvedilol (COREG) 25 MG tablet, Take 1 tablet (25 mg total) by mouth 2 (two) times daily with a meal., Disp: 180 tablet, Rfl: 3   losartan (COZAAR) 50 MG tablet, Take 1 tablet (50 mg total) by mouth every evening., Disp: 90 tablet, Rfl: 3   rosuvastatin (CRESTOR) 20 MG tablet,  Take 1 tablet (20 mg total) by mouth daily., Disp: 90 tablet, Rfl: 3   spironolactone (ALDACTONE) 25 MG tablet, Take 1 tablet (25 mg total) by mouth every morning., Disp: 90 tablet, Rfl: 3  Laboratory examination:   External labs:   Labs 03/14/2022:  Glucose 93 mg, serum creatinine 1.22, BUN 20,  A1c 6.4%.  Hb 15.4/HCT 46.4, platelets 235.  Normal indicis.  Labs 03/08/2022:  Total cholesterol 239, triglycerides 239, HDL 35, LDL 159.  Non-HDL cholesterol 204.   Radiology:    Cardiac Studies:   Lexiscan Nuclear stress test 02/13/2015: The left ventricular ejection fraction is mildly decreased (45-54%). Nuclear stress EF: 52%. The study is normal. This is a low risk study.  Echocardiogram 09/02/2021:   1. Left ventricular ejection fraction, by visual estimation, is 60 to 65%. The left ventricle has normal function. There is mildly increased left ventricular hypertrophy. The average left ventricular global longitudinal strain is -17.2 %.  2. Left ventricular diastolic parameters are consistent with Grade I diastolic dysfunction (impaired relaxation).  3. The left ventricle has no regional wall motion abnormalities.  4. Global right ventricle has normal systolic function.The right ventricular size is normal. No increase in right ventricular wall thickness.  5. The aortic valve is normal in structure. Aortic valve regurgitation is not visualized. Mild to moderate aortic valve sclerosis/calcification without any evidence of aortic stenosis.  6. There is mild dilatation of the ascending aorta measuring 43 mm.  7. There has been no significant change since the prior study on 08/23/2017.  Coronary calcium score 03/17/2022: LM: 34 LAD 568 LCx 78 RCA 3 Total Agatston score 883. MESA database percentile: 97th Ascending aorta 4.1 cm, descending aorta 2.9 cm.  Aortic calcification are noted in the thoracic aorta.  Ectatic ascending aorta.  No other significant extracardiac  abnormality.  PCV ECHOCARDIOGRAM COMPLETE 04/12/2022  Normal LV systolic function with visual EF 55-60%. Left ventricle cavity is normal in size. Moderate concentric hypertrophy of the left ventricle. Normal global wall motion. Doppler evidence of grade I (impaired) diastolic dysfunction, normal LAP. Trileaflet aortic valve with no regurgitation. Mild aortic valve leaflet calcification. Structurally normal tricuspid valve with trace regurgitation. No evidence of pulmonary hypertension. Structurally normal pulmonic valve.  Mild pulmonic regurgitation.   EKG:   EKG 03/08/2022: Normal sinus rhythm at the rate of 74 bpm, LAFB, incomplete right bundle branch block.  Poor R wave progression, probably normal variant.  No evidence of ischemia, normal QT interval.    Assessment     ICD-10-CM   1. Mixed hyperlipidemia  E78.2 rosuvastatin (CRESTOR) 20 MG tablet    ezetimibe (ZETIA) 10 MG tablet    2. Elevated coronary artery calcium score 03/17/2022: Total Agatston score 883. MESA database percentile: 97th  R93.1     3. Coronary artery disease involving native coronary artery of native heart without angina pectoris  I25.10     4. Primary hypertension  I10 spironolactone (ALDACTONE) 25 MG tablet    5. Aortic root dilation (HCC)  I77.810 losartan (COZAAR) 50 MG tablet    6. Chronic diastolic (congestive) heart failure (HCC)  I50.32 losartan (COZAAR) 50 MG tablet    carvedilol (COREG) 25 MG tablet       Medications Discontinued During This Encounter  Medication Reason   losartan (COZAAR) 50 MG tablet    rosuvastatin (CRESTOR) 10 MG tablet Reorder   carvedilol (COREG) 25 MG tablet Reorder   spironolactone (ALDACTONE) 25 MG tablet Reorder    Meds ordered this encounter  Medications   rosuvastatin (CRESTOR) 20 MG tablet    Sig: Take 1 tablet (20 mg total) by mouth daily.    Dispense:  90 tablet    Refill:  3   ezetimibe (ZETIA) 10 MG tablet    Sig: Take 1 tablet (10 mg total) by mouth daily.     Dispense:  90 tablet    Refill:  3   spironolactone (ALDACTONE) 25 MG tablet    Sig: Take 1 tablet (25 mg total) by mouth every morning.    Dispense:  90 tablet    Refill:  3    Please call our office to schedule an overdue appointment with Dr. Mayford Knife before anymore refills. [phone]. Thank you 3rd and Final Attempt   losartan (COZAAR) 50 MG tablet    Sig: Take 1 tablet (50 mg total) by mouth every evening.    Dispense:  90 tablet    Refill:  3   carvedilol (COREG) 25 MG tablet    Sig: Take 1 tablet (25 mg total) by mouth 2 (two) times daily with a meal.    Dispense:  180 tablet    Refill:  3   No orders of the defined types were placed in this encounter.  Recommendations:   Joel Hammond is a 61 y.o.  Male patient with hypertension, hyperlipidemia, chronic diastolic heart failure, severe OSA on CPAP, mildly dilated aortic root, sedentary lifestyle, history of diagnosis of H1 N1 flu in 2013 while in [place] and had developed severe viral cardiomyopathy.  He was seen by me 6 weeks ago, I did increase the dose of carvedilol to 25 mg twice daily for hypertension and also increased losartan from 25 to 50 mg daily for aortic root dilatation, he underwent echocardiogram and coronary CT calcium score and presents for follow-up.  Extensive discussion with the patient regarding very high coronary calcium score suggesting underlying coronary artery disease.  Patient does have sedentary lifestyle.  He is also morbidly obese with BMI of 49.  Extensive discussion regarding dietary modification, I also offered him GLP-1 agonist for weight loss and also for hyperglycemia.  Patient was worried about side effects and did not want to start this.  Reviewed the coronary calcium score, will increase Crestor dose to 20 mg daily and add Zetia 10 mg daily, goal LDL closer to 54.  Aortic root dilatation appears to be stable from 2019.  We will  continue annual surveillance by echocardiogram as  there is a good correlation with echo findings.  I will see him back in 6 months.  He has complete physical and labs being done at the end of next month and lipids can be followed through his PCP.   I also encouraged him to make an appointment to see Dr. Theressa Millard, patient states that he has been having difficulty with setting up the appointment.      Yates Decamp, MD, [facility] 04/27/2022, 6:54 PM Office: [phone]

## 2022-05-29 DIAGNOSIS — M109 Gout, unspecified: Secondary | ICD-10-CM | POA: Diagnosis not present

## 2023-07-18 ENCOUNTER — Other Ambulatory Visit: Payer: Self-pay | Admitting: Cardiology

## 2023-07-18 DIAGNOSIS — I10 Essential (primary) hypertension: Secondary | ICD-10-CM

## 2023-07-18 DIAGNOSIS — I5032 Chronic diastolic (congestive) heart failure: Secondary | ICD-10-CM

## 2023-07-18 DIAGNOSIS — E782 Mixed hyperlipidemia: Secondary | ICD-10-CM

## 2023-08-27 DIAGNOSIS — I130 Hypertensive heart and chronic kidney disease with heart failure and stage 1 through stage 4 chronic kidney disease, or unspecified chronic kidney disease: Secondary | ICD-10-CM | POA: Diagnosis not present

## 2023-08-27 DIAGNOSIS — G4733 Obstructive sleep apnea (adult) (pediatric): Secondary | ICD-10-CM | POA: Diagnosis not present

## 2023-08-27 DIAGNOSIS — E7800 Pure hypercholesterolemia, unspecified: Secondary | ICD-10-CM | POA: Diagnosis not present

## 2023-08-27 DIAGNOSIS — Z0000 Encounter for general adult medical examination without abnormal findings: Secondary | ICD-10-CM | POA: Diagnosis not present

## 2023-08-27 DIAGNOSIS — M109 Gout, unspecified: Secondary | ICD-10-CM | POA: Diagnosis not present

## 2023-08-27 DIAGNOSIS — Z125 Encounter for screening for malignant neoplasm of prostate: Secondary | ICD-10-CM | POA: Diagnosis not present

## 2023-08-27 DIAGNOSIS — R7303 Prediabetes: Secondary | ICD-10-CM | POA: Diagnosis not present

## 2023-08-27 DIAGNOSIS — Z23 Encounter for immunization: Secondary | ICD-10-CM | POA: Diagnosis not present
